# Patient Record
Sex: Male | Born: 1945 | Race: Black or African American | Hispanic: No | Marital: Single | Smoking: Former smoker
Health system: Southern US, Community
[De-identification: ages and names within clinical notes are randomized; demographics above are authoritative.]

## PROBLEM LIST (undated history)

## (undated) DIAGNOSIS — J45909 Unspecified asthma, uncomplicated: Secondary | ICD-10-CM

## (undated) DIAGNOSIS — I10 Essential (primary) hypertension: Secondary | ICD-10-CM

## (undated) DIAGNOSIS — T7840XA Allergy, unspecified, initial encounter: Secondary | ICD-10-CM

## (undated) DIAGNOSIS — C61 Malignant neoplasm of prostate: Secondary | ICD-10-CM

## (undated) HISTORY — DX: Allergy, unspecified, initial encounter: T78.40XA

## (undated) HISTORY — DX: Unspecified asthma, uncomplicated: J45.909

## (undated) HISTORY — PX: PROSTATE BIOPSY: SHX241

## (undated) HISTORY — PX: MOUTH SURGERY: SHX715

---

## 2002-04-28 ENCOUNTER — Ambulatory Visit (HOSPITAL_COMMUNITY): Admission: RE | Admit: 2002-04-28 | Discharge: 2002-04-28 | Payer: Self-pay | Admitting: Gastroenterology

## 2003-12-20 ENCOUNTER — Encounter: Admission: RE | Admit: 2003-12-20 | Discharge: 2003-12-20 | Payer: Self-pay | Admitting: Internal Medicine

## 2011-07-08 ENCOUNTER — Other Ambulatory Visit: Payer: Self-pay | Admitting: Family Medicine

## 2011-07-08 ENCOUNTER — Ambulatory Visit
Admission: RE | Admit: 2011-07-08 | Discharge: 2011-07-08 | Disposition: A | Discharge status: Home / Self Care | Payer: Self-pay | Source: Ambulatory Visit | Attending: Family Medicine | Admitting: Family Medicine

## 2011-07-08 DIAGNOSIS — R059 Cough, unspecified: Secondary | ICD-10-CM

## 2011-07-08 DIAGNOSIS — R05 Cough: Secondary | ICD-10-CM

## 2012-09-15 ENCOUNTER — Encounter: Payer: Managed Care, Other (non HMO) | Attending: Internal Medicine | Admitting: *Deleted

## 2012-09-15 DIAGNOSIS — Z713 Dietary counseling and surveillance: Secondary | ICD-10-CM | POA: Insufficient documentation

## 2012-09-15 DIAGNOSIS — E119 Type 2 diabetes mellitus without complications: Secondary | ICD-10-CM | POA: Insufficient documentation

## 2012-09-24 ENCOUNTER — Encounter: Payer: Self-pay | Admitting: *Deleted

## 2012-09-24 NOTE — Patient Instructions (Signed)
Goals:  Follow Diabetes Meal Plan as instructed  Eat 3 meals and 2 snacks, every 3-5 hrs  Limit carbohydrate intake to 60 grams carbohydrate/meal  Limit carbohydrate intake to 0-30 grams carbohydrate/snack  Add lean protein foods to meals/snacks  Monitor glucose levels as instructed by your doctor  Aim for 15-30 mins of physical activity daily  Bring food record and glucose log to your next nutrition visit 

## 2012-09-24 NOTE — Progress Notes (Signed)
  Patient was seen on 09/15/2012 for the first of a series of three diabetes self-management courses at the [facility]. The following learning objectives were met by the patient during this course:   Defines the role of glucose and insulin  Identifies type of diabetes and pathophysiology  Defines the diagnostic criteria for diabetes and prediabetes  States the risk factors for Type 2 Diabetes  States the symptoms of Type 2 Diabetes  Defines Type 2 Diabetes treatment goals  Defines Type 2 Diabetes treatment options  States the rationale for glucose monitoring  Identifies A1C, glucose targets, and testing times  Identifies proper sharps disposal  Defines the purpose of a diabetes food plan  Identifies carbohydrate food groups  Defines effects of carbohydrate foods on glucose levels  Identifies carbohydrate choices/grams/food labels  States benefits of physical activity and effect on glucose  Review of suggested activity guidelines  Handouts given during class include:  Type 2 Diabetes: Basics Book  My Food Plan Book  Food and Activity Log  Follow-Up Plan: Patient agrees to call to schedule for Core 2 and Core 3 Classes

## 2013-10-11 ENCOUNTER — Other Ambulatory Visit: Payer: Self-pay | Admitting: Nurse Practitioner

## 2013-10-11 ENCOUNTER — Ambulatory Visit
Admission: RE | Admit: 2013-10-11 | Discharge: 2013-10-11 | Disposition: A | Discharge status: Home / Self Care | Payer: Self-pay | Source: Ambulatory Visit | Attending: Nurse Practitioner | Admitting: Nurse Practitioner

## 2013-10-11 DIAGNOSIS — R058 Other specified cough: Secondary | ICD-10-CM

## 2013-10-11 DIAGNOSIS — R05 Cough: Secondary | ICD-10-CM

## 2016-11-21 ENCOUNTER — Ambulatory Visit (INDEPENDENT_AMBULATORY_CARE_PROVIDER_SITE_OTHER): Payer: 59

## 2016-11-21 ENCOUNTER — Encounter (HOSPITAL_COMMUNITY): Payer: Self-pay | Admitting: Family Medicine

## 2016-11-21 ENCOUNTER — Ambulatory Visit (HOSPITAL_COMMUNITY)
Admission: EM | Admit: 2016-11-21 | Discharge: 2016-11-21 | Disposition: A | Discharge status: Home / Self Care | Payer: 59 | Attending: Family Medicine | Admitting: Family Medicine

## 2016-11-21 DIAGNOSIS — J4531 Mild persistent asthma with (acute) exacerbation: Secondary | ICD-10-CM | POA: Diagnosis not present

## 2016-11-21 HISTORY — DX: Essential (primary) hypertension: I10

## 2016-11-21 MED ORDER — AZITHROMYCIN 250 MG PO TABS: ORAL_TABLET | ORAL | 0 refills | Status: DC

## 2016-11-21 MED ORDER — ALBUTEROL SULFATE HFA 108 (90 BASE) MCG/ACT IN AERS: 2.0000 | INHALATION_SPRAY | Freq: Four times a day (QID) | RESPIRATORY_TRACT | 0 refills | Status: DC | PRN

## 2016-11-21 MED ORDER — ALBUTEROL SULFATE (2.5 MG/3ML) 0.083% IN NEBU
INHALATION_SOLUTION | RESPIRATORY_TRACT | Status: AC
  Filled 2016-11-21: qty 6

## 2016-11-21 MED ORDER — METHYLPREDNISOLONE SODIUM SUCC 125 MG IJ SOLR
INTRAMUSCULAR | Status: AC
  Filled 2016-11-21: qty 2

## 2016-11-21 MED ORDER — METHYLPREDNISOLONE 4 MG PO TBPK
ORAL_TABLET | ORAL | 0 refills | Status: DC
Start: 2016-11-21 — End: 2018-03-06

## 2016-11-21 MED ORDER — IPRATROPIUM BROMIDE 0.02 % IN SOLN
0.5000 mg | Freq: Once | RESPIRATORY_TRACT | Status: AC
  Administered 2016-11-21: 0.5 mg via RESPIRATORY_TRACT

## 2016-11-21 MED ORDER — METHYLPREDNISOLONE SODIUM SUCC 125 MG IJ SOLR
125.0000 mg | Freq: Once | INTRAMUSCULAR | Status: AC
  Administered 2016-11-21: 125 mg via INTRAMUSCULAR

## 2016-11-21 MED ORDER — IPRATROPIUM BROMIDE 0.02 % IN SOLN
RESPIRATORY_TRACT | Status: AC
  Filled 2016-11-21: qty 2.5

## 2016-11-21 MED ORDER — ALBUTEROL SULFATE (2.5 MG/3ML) 0.083% IN NEBU
5.0000 mg | INHALATION_SOLUTION | Freq: Once | RESPIRATORY_TRACT | Status: AC
  Administered 2016-11-21: 5 mg via RESPIRATORY_TRACT

## 2016-11-21 NOTE — ED Triage Notes (Signed)
Here for 2 days of URI symptoms and asthma. sts using inhaler with some relief.

## 2016-11-21 NOTE — ED Notes (Signed)
Rounded pt x 2

## 2016-11-21 NOTE — ED Provider Notes (Signed)
[facility]    CSN: [id number] Arrival date & time: 11/21/16  1048     History   Chief Complaint Chief Complaint  Patient presents with  . Cough  . Asthma    HPI Matthew Caldwell is a 70 y.o. male.   The history is provided by the patient.  Cough  Cough characteristics:  Productive Sputum characteristics:  White Severity:  Moderate Duration:  2 days Progression:  Worsening Chronicity:  New Smoker: no   Context: upper respiratory infection and weather changes   Relieved by:  Beta-agonist inhaler Associated symptoms: chills, fever, rhinorrhea and wheezing   Asthma     Past Medical History:  Diagnosis Date  . Asthma   . Hypertension     There are no active problems to display for this patient.   History reviewed. No pertinent surgical history.     Home Medications    Prior to Admission medications   Medication Sig Start Date End Date Taking? Authorizing Provider  acetaminophen-codeine (TYLENOL #3) 300-30 MG per tablet Take 1 tablet by mouth every 4 (four) hours as needed.    Historical Provider, MD  albuterol (PROVENTIL HFA;VENTOLIN HFA) 108 (90 BASE) MCG/ACT inhaler Inhale 2 puffs into the lungs every 6 (six) hours as needed.    Historical Provider, MD  ALPRAZolam Duanne Moron) 0.25 MG tablet Take 0.25 mg by mouth at bedtime as needed.    Historical Provider, MD  fexofenadine (ALLEGRA) 180 MG tablet Take 180 mg by mouth daily.    Historical Provider, MD  hydrocortisone (ANUSOL-HC) 2.5 % rectal cream Place rectally 2 (two) times daily.    Historical Provider, MD  lisinopril-hydrochlorothiazide (PRINZIDE,ZESTORETIC) 20-12.5 MG per tablet Take 1 tablet by mouth daily.    Historical Provider, MD  tadalafil (CIALIS) 20 MG tablet Take 20 mg by mouth daily as needed.    Historical Provider, MD  zolpidem (AMBIEN) 10 MG tablet Take 10 mg by mouth at bedtime as needed.    Historical Provider, MD    Family History History reviewed. No pertinent family  history.  Social History Social History  Substance Use Topics  . Smoking status: Never Smoker  . Smokeless tobacco: Never Used  . Alcohol use Yes     Comment: 3 a week     Allergies   Penicillins   Review of Systems Review of Systems  Constitutional: Positive for chills and fever.  HENT: Positive for congestion, postnasal drip and rhinorrhea.   Respiratory: Positive for cough and wheezing.   Cardiovascular: Negative.   All other systems reviewed and are negative.    Physical Exam Triage Vital Signs ED Triage Vitals [11/21/16 1119]  Enc Vitals Group     BP 174/67     Pulse Rate 90     Resp 20     Temp 99.3 F (37.4 C)     Temp src      SpO2 96 %     Weight      Height      Head Circumference      Peak Flow      Pain Score      Pain Loc      Pain Edu?      Excl. in [place]?    No data found.   Updated Vital Signs BP 174/67   Pulse 90   Temp 99.3 F (37.4 C)   Resp 20   SpO2 96%   Visual Acuity Right Eye Distance:   Left Eye Distance:  Bilateral Distance:    Right Eye Near:   Left Eye Near:    Bilateral Near:     Physical Exam  Constitutional: He is oriented to person, place, and time. He appears well-developed and well-nourished.  HENT:  Right Ear: External ear normal.  Left Ear: External ear normal.  Nose: Nose normal.  Mouth/Throat: Oropharynx is clear and moist.  Eyes: Pupils are equal, round, and reactive to light.  Neck: Normal range of motion. Neck supple.  Cardiovascular: Normal rate, regular rhythm, normal heart sounds and intact distal pulses.   Pulmonary/Chest: He has wheezes. He has rales.  Lymphadenopathy:    He has no cervical adenopathy.  Neurological: He is alert and oriented to person, place, and time.  Skin: Skin is warm and dry.  Nursing note and vitals reviewed.    UC Treatments / Results  Labs (all labs ordered are listed, but only abnormal results are displayed) Labs Reviewed - No data to display  EKG  EKG  Interpretation None       Radiology No results found. X-rays reviewed and report per radiologist.  Procedures Procedures (including critical care time)  Medications Ordered in UC Medications - No data to display   Initial Impression / Assessment and Plan / UC Course  I have reviewed the triage vital signs and the nursing notes.  Pertinent labs & imaging results that were available during my care of the patient were reviewed by me and considered in my medical decision making (see chart for details).  Clinical Course       Final Clinical Impressions(s) / UC Diagnoses   Final diagnoses:  None    New Prescriptions New Prescriptions   No medications on file     Billy Fischer, MD 11/21/16 2108

## 2016-12-10 DIAGNOSIS — I10 Essential (primary) hypertension: Secondary | ICD-10-CM | POA: Diagnosis not present

## 2016-12-10 DIAGNOSIS — Z0000 Encounter for general adult medical examination without abnormal findings: Secondary | ICD-10-CM | POA: Diagnosis not present

## 2016-12-10 DIAGNOSIS — E1165 Type 2 diabetes mellitus with hyperglycemia: Secondary | ICD-10-CM | POA: Diagnosis not present

## 2017-01-28 DIAGNOSIS — R972 Elevated prostate specific antigen [PSA]: Secondary | ICD-10-CM | POA: Diagnosis not present

## 2017-01-28 DIAGNOSIS — N400 Enlarged prostate without lower urinary tract symptoms: Secondary | ICD-10-CM | POA: Diagnosis not present

## 2017-03-13 DIAGNOSIS — N401 Enlarged prostate with lower urinary tract symptoms: Secondary | ICD-10-CM | POA: Diagnosis not present

## 2017-03-13 DIAGNOSIS — R972 Elevated prostate specific antigen [PSA]: Secondary | ICD-10-CM | POA: Diagnosis not present

## 2017-03-13 DIAGNOSIS — E119 Type 2 diabetes mellitus without complications: Secondary | ICD-10-CM | POA: Diagnosis not present

## 2017-04-16 ENCOUNTER — Other Ambulatory Visit: Payer: Self-pay | Admitting: Internal Medicine

## 2017-04-16 DIAGNOSIS — R972 Elevated prostate specific antigen [PSA]: Secondary | ICD-10-CM

## 2017-05-05 ENCOUNTER — Ambulatory Visit
Admission: RE | Admit: 2017-05-05 | Discharge: 2017-05-05 | Disposition: A | Discharge status: Home / Self Care | Payer: 59 | Source: Ambulatory Visit | Attending: Internal Medicine | Admitting: Internal Medicine

## 2017-05-05 DIAGNOSIS — R972 Elevated prostate specific antigen [PSA]: Secondary | ICD-10-CM

## 2017-05-05 MED ORDER — GADOBENATE DIMEGLUMINE 529 MG/ML IV SOLN
19.0000 mL | Freq: Once | INTRAVENOUS | Status: AC | PRN
  Administered 2017-05-05: 19 mL via INTRAVENOUS

## 2017-07-02 DIAGNOSIS — G4700 Insomnia, unspecified: Secondary | ICD-10-CM | POA: Diagnosis not present

## 2017-07-02 DIAGNOSIS — E119 Type 2 diabetes mellitus without complications: Secondary | ICD-10-CM | POA: Diagnosis not present

## 2017-07-02 DIAGNOSIS — I10 Essential (primary) hypertension: Secondary | ICD-10-CM | POA: Diagnosis not present

## 2017-08-18 DIAGNOSIS — H17821 Peripheral opacity of cornea, right eye: Secondary | ICD-10-CM | POA: Diagnosis not present

## 2017-08-18 DIAGNOSIS — H2513 Age-related nuclear cataract, bilateral: Secondary | ICD-10-CM | POA: Diagnosis not present

## 2017-08-18 DIAGNOSIS — H40023 Open angle with borderline findings, high risk, bilateral: Secondary | ICD-10-CM | POA: Diagnosis not present

## 2017-08-25 DIAGNOSIS — R972 Elevated prostate specific antigen [PSA]: Secondary | ICD-10-CM | POA: Diagnosis not present

## 2017-08-28 DIAGNOSIS — Z23 Encounter for immunization: Secondary | ICD-10-CM | POA: Diagnosis not present

## 2017-09-08 DIAGNOSIS — R972 Elevated prostate specific antigen [PSA]: Secondary | ICD-10-CM | POA: Diagnosis not present

## 2017-09-10 ENCOUNTER — Encounter: Payer: Self-pay | Admitting: Radiation Oncology

## 2017-09-18 ENCOUNTER — Encounter: Payer: Self-pay | Admitting: Radiation Oncology

## 2017-09-18 NOTE — Progress Notes (Signed)
GU Location of Tumor / Histology: prostatic adenocarcinoma  If Prostate Cancer, Gleason Score is (3 + 4) and PSA is (7.22). Prostate volume: 26 grams.  Matthew Caldwell reports his PCP, Glendale Chard, noted an elevated PSA in 2016. Patient seen by Dr. Pilar Jarvis in March but, seen by another provider at [facility] (unsure of name) that retired before that.  Biopsies of prostate (if applicable) revealed:    Past/Anticipated interventions by urology, if any: MRI fusion biopsy, referral to radiation oncology  Past/Anticipated interventions by medical oncology, if any: no  Weight changes, if any: no  Bowel/Bladder complaints, if any: nocturia, ED . Patient denies dysuria, hematuria, leakage or incontinence.   Nausea/Vomiting, if any: no  Pain issues, if any:  Occasionally related to sciatica  SAFETY ISSUES:  Prior radiation? no  Pacemaker/ICD? no  Possible current pregnancy? no  Is the patient on methotrexate? no  Current Complaints / other details:  71 year old male. Married. Lives in [place]. Works as a Pharmacist, hospital. Anxious. Most interested in seeds. Patient hopes to receive treatment in that states before returning to [place] in a year.

## 2017-09-24 ENCOUNTER — Ambulatory Visit
Admission: RE | Admit: 2017-09-24 | Discharge: 2017-09-24 | Disposition: A | Discharge status: Home / Self Care | Payer: 59 | Source: Ambulatory Visit | Attending: Radiation Oncology | Admitting: Radiation Oncology

## 2017-09-24 ENCOUNTER — Encounter: Payer: Self-pay | Admitting: Radiation Oncology

## 2017-09-24 VITALS — BP 164/83 | HR 84 | Temp 98.2°F | Resp 16 | Ht 65.0 in | Wt 209.2 lb

## 2017-09-24 DIAGNOSIS — Z87891 Personal history of nicotine dependence: Secondary | ICD-10-CM | POA: Insufficient documentation

## 2017-09-24 DIAGNOSIS — Z880 Allergy status to penicillin: Secondary | ICD-10-CM | POA: Diagnosis not present

## 2017-09-24 DIAGNOSIS — J45909 Unspecified asthma, uncomplicated: Secondary | ICD-10-CM | POA: Insufficient documentation

## 2017-09-24 DIAGNOSIS — C61 Malignant neoplasm of prostate: Secondary | ICD-10-CM | POA: Insufficient documentation

## 2017-09-24 DIAGNOSIS — I10 Essential (primary) hypertension: Secondary | ICD-10-CM | POA: Diagnosis not present

## 2017-09-24 DIAGNOSIS — Z9889 Other specified postprocedural states: Secondary | ICD-10-CM | POA: Insufficient documentation

## 2017-09-24 DIAGNOSIS — Z510 Encounter for antineoplastic radiation therapy: Secondary | ICD-10-CM | POA: Insufficient documentation

## 2017-09-24 DIAGNOSIS — Z7982 Long term (current) use of aspirin: Secondary | ICD-10-CM | POA: Diagnosis not present

## 2017-09-24 DIAGNOSIS — Z79899 Other long term (current) drug therapy: Secondary | ICD-10-CM | POA: Diagnosis not present

## 2017-09-24 DIAGNOSIS — R972 Elevated prostate specific antigen [PSA]: Secondary | ICD-10-CM | POA: Diagnosis not present

## 2017-09-24 DIAGNOSIS — Z7952 Long term (current) use of systemic steroids: Secondary | ICD-10-CM | POA: Insufficient documentation

## 2017-09-24 HISTORY — DX: Malignant neoplasm of prostate: C61

## 2017-09-24 NOTE — Progress Notes (Signed)
Radiation Oncology         [phone]) [phone] ________________________________  Initial Outpatient Consultation  Name: Matthew Caldwell MRN: [id number]  Date: 09/24/2017  DOB: 1946/02/22  WU:[id number], Levada Dy, NP  Nickie Retort, MD   REFERRING PHYSICIAN: Nickie Retort, MD  DIAGNOSIS: 72 y.o. gentleman with Stage T1c adenocarcinoma of the prostate with Gleason Score of 3+4, and PSA of 7.22    ICD-10-CM   1. Malignant neoplasm of prostate ([place]) C61     HISTORY OF PRESENT ILLNESS: Matthew Caldwell is a 71 y.o. male with a diagnosis of prostate cancer. He was noted to have an elevated PSA of 3.63 by his primary care physician, Dr. Glendale Chard, in November 2016. He was referred for evaluation at [facility] and DRE at the time revealed a symmetrical, non-nodular prostate.  The patient elected to defer prostate biopsy and instead proceed with close monitoring of his PSA. PSA in January 2018 was elevated to 7.4. Accordingly, he was referred back to urology for further evaluation.  He was seen by Dr. Baruch Gouty on 01/28/2017, where a digital rectal examination was performed at that time revealing a symmetrical prostate without nodules. He had a repeat PSA at that time to confirm true evaluation. PSA on 01/28/17 was lower at 5.36 but still elevated. An MRI of the prostate was performed on 05/05/17 which showed an 11 mm lesion in the right lateral mid gland, considered PI-RADS 3. The lesion appeared to abut the capsule but there was no definite evidence of ECE, SVI, PNI, LVI or metastatic disease. The patient proceeded to MRI fusion biopsy of the prostate on 08/25/2017. Repeat PSA at the time of biopsy was 7.22. The prostate volume measured 26 cc.  Out of 16 samples, 5 were positive.  The maximum Gleason score was 3+4, and this was seen in the right base lateral core as well as in 4 cores (MRI-targeted samples) from the lesion in the right mid lateral gland.  Biopsies of prostate (if  applicable) revealed:       The patient reviewed the biopsy results with his urologist and he has kindly been referred today for discussion of potential radiation treatment options. He would like to receive treatment while in the [place] before returning to [place] in one year.   PREVIOUS RADIATION THERAPY: No  PAST MEDICAL HISTORY:  Past Medical History:  Diagnosis Date  . Asthma   . Hypertension   . Prostate cancer ([place])       PAST SURGICAL HISTORY: Past Surgical History:  Procedure Laterality Date  . MOUTH SURGERY    . PROSTATE BIOPSY      FAMILY HISTORY:  Family History  Problem Relation Age of Onset  . Cancer Neg Hx     SOCIAL HISTORY:  Social History   Social History  . Marital status: Single    Spouse name: N/A  . Number of children: N/A  . Years of education: N/A   Occupational History  . Not on file.   Social History Main Topics  . Smoking status: Former Smoker    Packs/day: 0.25    Years: 1.00    Types: Cigarettes    Quit date: 11/25/1997  . Smokeless tobacco: Never Used  . Alcohol use Yes     Comment: 3 a week  . Drug use: No  . Sexual activity: Yes   Other Topics Concern  . Not on file   Social History Narrative  . No narrative on file  He is  married and resides in [place], working as a Network engineer at [employer]. He is originally from [place]. He enjoys volunteering (storytelling in kindergarten classes) and is very active with cycling and going to the gym.   ALLERGIES: Penicillins  MEDICATIONS:  Current Outpatient Prescriptions  Medication Sig Dispense Refill  . albuterol (PROVENTIL HFA;VENTOLIN HFA) 108 (90 Base) MCG/ACT inhaler Inhale 2 puffs into the lungs every 6 (six) hours as needed for wheezing or shortness of breath. 1 Inhaler 0  . ALPRAZolam (XANAX) 0.25 MG tablet Take 0.25 mg by mouth at bedtime as needed.    Marland Kitchen aspirin 325 MG tablet Take 325 mg by mouth daily.    . diphenhydrAMINE (SOMINEX) 25 MG tablet Take 25 mg  by mouth at bedtime as needed for sleep.    Marland Kitchen lisinopril-hydrochlorothiazide (PRINZIDE,ZESTORETIC) 20-12.5 MG per tablet Take 1 tablet by mouth daily.    . Multiple Vitamin (MULTIVITAMIN) tablet Take 1 tablet by mouth daily.    . tadalafil (CIALIS) 20 MG tablet Take 20 mg by mouth daily as needed.    . zolpidem (AMBIEN) 10 MG tablet Take 10 mg by mouth at bedtime as needed.    . hydrocortisone (ANUSOL-HC) 2.5 % rectal cream Place rectally 2 (two) times daily.    . methylPREDNISolone (MEDROL DOSEPAK) 4 MG TBPK tablet follow package directions, start on fri, take until finished (Patient not taking: Reported on 09/24/2017) 21 tablet 0   No current facility-administered medications for this encounter.     REVIEW OF SYSTEMS:  On review of systems, the patient reports that he is doing well overall. He denies any chest pain, shortness of breath, cough, fevers, chills, night sweats, or unintended weight changes. He denies any bowel disturbances, and denies abdominal pain, nausea or vomiting. He reports occasional pain related to sciatica. His IPSS was 2, indicating mild urinary symptoms, including nocturia. He denies any dysuria, hematuria, leakage or incontinence. He is able to complete sexual activity with most attempts. He reports having anxiety related to diagnosis and treatment. A complete review of systems is obtained and is otherwise negative.    PHYSICAL EXAM:  Wt Readings from Last 3 Encounters:  09/24/17 209 lb 3.2 oz (94.9 kg)  09/24/12 205 lb 1.6 oz (93 kg)   Temp Readings from Last 3 Encounters:  09/24/17 98.2 F (36.8 C) (Oral)  11/21/16 99.3 F (37.4 C)   BP Readings from Last 3 Encounters:  09/24/17 (!) 164/83  11/21/16 174/67   Pulse Readings from Last 3 Encounters:  09/24/17 84  11/21/16 90   Pain Assessment Pain Score: 0-No pain/10  In general this is a well appearing African man in no acute distress. He is alert and oriented x4 and appropriate throughout the  examination. HEENT reveals that the patient is normocephalic, atraumatic. Cardiovascular exam reveals a regular rate and rhythm, no clicks rubs or murmurs are auscultated. Chest is clear to auscultation bilaterally. Lymphatic assessment is performed and does not reveal any adenopathy in the cervical, supraclavicular, axillary, or inguinal chains. Abdomen has active bowel sounds in all quadrants and is intact. The abdomen is soft, non tender, non distended. Lower extremities are negative for pretibial pitting edema, deep calf tenderness, cyanosis or clubbing.   KPS = 100  100 - Normal; no complaints; no evidence of disease. 90   - Able to carry on normal activity; minor signs or symptoms of disease. 80   - Normal activity with effort; some signs or symptoms of disease. 25   - Cares for  self; unable to carry on normal activity or to do active work. 60   - Requires occasional assistance, but is able to care for most of his personal needs. 50   - Requires considerable assistance and frequent medical care. 63   - Disabled; requires special care and assistance. 4   - Severely disabled; hospital admission is indicated although death not imminent. 42   - Very sick; hospital admission necessary; active supportive treatment necessary. 10   - Moribund; fatal processes progressing rapidly. 0     - Dead  Karnofsky DA, Abelmann WH, Craver LS and Burchenal JH [phone]) The use of the nitrogen mustards in the palliative treatment of carcinoma: with particular reference to bronchogenic carcinoma Cancer 1 634-56  LABORATORY DATA:  No results found for: WBC, HGB, HCT, MCV, PLT No results found for: NA, K, CL, CO2 No results found for: ALT, AST, GGT, ALKPHOS, BILITOT   RADIOGRAPHY: No results found.    IMPRESSION/PLAN: 1. 71 y.o. gentleman with Stage T1c adenocarcinoma of the prostate with Gleason Score of 3+4, and PSA of 7.22. We discussed the patient's workup and outlined the nature of prostate cancer in  this setting. The patient's T stage, Gleason's score, and PSA put him into the intermediate risk group. Accordingly, he is eligible for a variety of potential treatment options including brachytherapy, 8 weeks of external radiation or 5 weeks of external radiation followed by a brachytherapy boost. We discussed the available radiation techniques, and focused on the details of logistics and delivery. We discussed and outlined the risks, benefits, short and long-term effects associated with radiotherapy and compared and contrasted these with prostatectomy. The patient is not interested in surgical removal of the prostate.  At the end of our conversation today, he elects to proceed with 8 weeks of prostate IMRT but would like to postpone starting treatment until 11/2017 due to travel plans in December. 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 We will share our findings with Dr. Pilar Jarvis and move forward with scheduling placement of three gold fiducial markers into the prostate prior to CT SImulation in preparation to proceed with IMRT in January 2019.   We enjoyed meeting with him today, and will look forward to participating in the care of this very nice gentleman.  We spent 75 minutes face to face with the patient and more than 50% of that time was spent in counseling and/or coordination of care.    Nicholos Johns, PA-C    Tyler Pita, MD  [facility] Direct Dial: [phone]  Fax: [phone] [email or web address]  Skype  LinkedIn    Page Me     This document serves as a record of services personally performed by Tyler Pita, MD and Freeman Caldron, PA-C. It was created on their behalf by Rae Lips, a trained medical scribe. The creation of this record is based on the scribe's personal observations and the providers' statements to them. This document has been  checked and approved by the attending providers.

## 2017-09-24 NOTE — Progress Notes (Signed)
See progress note under physician encounter. 

## 2017-09-26 ENCOUNTER — Telehealth: Payer: Self-pay | Admitting: *Deleted

## 2017-09-26 NOTE — Telephone Encounter (Signed)
CALLED PATIENT TO INFORM OF GOLD SEED PLACEMENT ON 10-22-17 - ARRIVAL TIME - 8 AM @ DR. BUDZYN'S OFFICE AND HIS SIM ON 11-28-17 @ 1 PM @ DR. MANNING'S OFFICE, SPOKE WITH PATIENT AND HE IS AWARE OF THESE APPTS.

## 2017-10-09 ENCOUNTER — Encounter: Payer: Self-pay | Admitting: Radiation Oncology

## 2017-10-09 NOTE — Progress Notes (Addendum)
Rec'd documents for FMLA for patient. Placed on cart to give to London Pepper, RN to help complete. Patient brought them to the front window to [facility] and states that they would like to be called on cell number that is correct in Epic when completed.

## 2017-10-14 ENCOUNTER — Telehealth: Payer: Self-pay | Admitting: Radiation Oncology

## 2017-10-14 NOTE — Telephone Encounter (Signed)
Placed orange folder with complete FMLA paperwork in Matthew Caldwell's inbox to sign. 

## 2017-10-15 ENCOUNTER — Encounter: Payer: Self-pay | Admitting: Urology

## 2017-10-15 NOTE — Progress Notes (Signed)
Matthew Caldwell came by for his FMLA forms. I was able to give him the originals and made a copy for his chart to scan. Everything was shown to be completed on our part.

## 2017-10-21 DIAGNOSIS — R0982 Postnasal drip: Secondary | ICD-10-CM | POA: Diagnosis not present

## 2017-10-21 DIAGNOSIS — E119 Type 2 diabetes mellitus without complications: Secondary | ICD-10-CM | POA: Diagnosis not present

## 2017-10-21 DIAGNOSIS — I10 Essential (primary) hypertension: Secondary | ICD-10-CM | POA: Diagnosis not present

## 2017-10-22 DIAGNOSIS — C61 Malignant neoplasm of prostate: Secondary | ICD-10-CM | POA: Diagnosis not present

## 2017-11-27 NOTE — Progress Notes (Signed)
  Radiation Oncology         [phone] ________________________________  Name: Matthew Caldwell MRN: [id number]  Date: 11/28/2017  DOB: 1946/07/14  SIMULATION AND TREATMENT PLANNING NOTE    ICD-10-CM   1. Malignant neoplasm of prostate ([place]) C61     DIAGNOSIS:  72 y.o. gentleman with Stage T1c adenocarcinoma of the prostate with Gleason Score of 3+4, and PSA of 7.22  NARRATIVE:  The patient was brought to the [facility].  Identity was confirmed.  All relevant records and images related to the planned course of therapy were reviewed.  The patient freely provided informed written consent to proceed with treatment after reviewing the details related to the planned course of therapy. The consent form was witnessed and verified by the simulation staff.  Then, the patient was set-up in a stable reproducible supine position for radiation therapy.  A vacuum lock pillow device was custom fabricated to position his legs in a reproducible immobilized position.  Then, I performed a urethrogram under sterile conditions to identify the prostatic apex.  CT images were obtained.  Surface markings were placed.  The CT images were loaded into the planning software.  Then the prostate target and avoidance structures including the rectum, bladder, bowel and hips were contoured.  Treatment planning then occurred.  The radiation prescription was entered and confirmed.  A total of one complex treatment devices was fabricated. I have requested : Intensity Modulated Radiotherapy (IMRT) is medically necessary for this case for the following reason:  Rectal sparing.Marland Kitchen  PLAN:  The patient will receive 78 Gy in 40 fractions.  ________________________________  Sheral Apley Tammi Klippel, M.D.

## 2017-11-28 ENCOUNTER — Encounter: Payer: Self-pay | Admitting: Medical Oncology

## 2017-11-28 ENCOUNTER — Ambulatory Visit
Admission: RE | Admit: 2017-11-28 | Discharge: 2017-11-28 | Disposition: A | Discharge status: Home / Self Care | Payer: 59 | Source: Ambulatory Visit | Attending: Radiation Oncology | Admitting: Radiation Oncology

## 2017-11-28 DIAGNOSIS — C61 Malignant neoplasm of prostate: Secondary | ICD-10-CM | POA: Diagnosis not present

## 2017-11-28 DIAGNOSIS — J45909 Unspecified asthma, uncomplicated: Secondary | ICD-10-CM | POA: Diagnosis not present

## 2017-11-28 DIAGNOSIS — Z510 Encounter for antineoplastic radiation therapy: Secondary | ICD-10-CM | POA: Diagnosis not present

## 2017-11-28 NOTE — Progress Notes (Signed)
Introduced myself to Matthew Caldwell as the prostate nurse navigator and my role. I was unable to meet him the day he consulted with Dr. Tammi Klippel. I gave him my business card and asked him to call me with questions or concerns. I will continue to follow.

## 2017-12-03 ENCOUNTER — Ambulatory Visit: Payer: 59 | Admitting: Radiation Oncology

## 2017-12-04 ENCOUNTER — Ambulatory Visit: Payer: 59

## 2017-12-04 DIAGNOSIS — C61 Malignant neoplasm of prostate: Secondary | ICD-10-CM | POA: Diagnosis not present

## 2017-12-04 DIAGNOSIS — Z510 Encounter for antineoplastic radiation therapy: Secondary | ICD-10-CM | POA: Diagnosis not present

## 2017-12-05 ENCOUNTER — Ambulatory Visit: Payer: 59

## 2017-12-08 ENCOUNTER — Ambulatory Visit: Payer: 59

## 2017-12-09 ENCOUNTER — Ambulatory Visit: Payer: 59

## 2017-12-09 DIAGNOSIS — Z510 Encounter for antineoplastic radiation therapy: Secondary | ICD-10-CM | POA: Diagnosis not present

## 2017-12-09 DIAGNOSIS — C61 Malignant neoplasm of prostate: Secondary | ICD-10-CM | POA: Diagnosis not present

## 2017-12-10 ENCOUNTER — Ambulatory Visit
Admission: RE | Admit: 2017-12-10 | Discharge: 2017-12-10 | Disposition: A | Discharge status: Home / Self Care | Payer: 59 | Source: Ambulatory Visit | Attending: Radiation Oncology | Admitting: Radiation Oncology

## 2017-12-10 ENCOUNTER — Ambulatory Visit: Payer: 59

## 2017-12-10 DIAGNOSIS — Z510 Encounter for antineoplastic radiation therapy: Secondary | ICD-10-CM | POA: Diagnosis not present

## 2017-12-10 DIAGNOSIS — C61 Malignant neoplasm of prostate: Secondary | ICD-10-CM | POA: Diagnosis not present

## 2017-12-11 ENCOUNTER — Ambulatory Visit
Admission: RE | Admit: 2017-12-11 | Discharge: 2017-12-11 | Disposition: A | Discharge status: Home / Self Care | Payer: 59 | Source: Ambulatory Visit | Attending: Radiation Oncology | Admitting: Radiation Oncology

## 2017-12-11 ENCOUNTER — Ambulatory Visit: Payer: 59

## 2017-12-11 DIAGNOSIS — Z510 Encounter for antineoplastic radiation therapy: Secondary | ICD-10-CM | POA: Diagnosis not present

## 2017-12-11 DIAGNOSIS — C61 Malignant neoplasm of prostate: Secondary | ICD-10-CM | POA: Diagnosis not present

## 2017-12-12 ENCOUNTER — Ambulatory Visit
Admission: RE | Admit: 2017-12-12 | Discharge: 2017-12-12 | Disposition: A | Discharge status: Home / Self Care | Payer: 59 | Source: Ambulatory Visit | Attending: Radiation Oncology | Admitting: Radiation Oncology

## 2017-12-12 ENCOUNTER — Ambulatory Visit: Payer: 59

## 2017-12-12 DIAGNOSIS — C61 Malignant neoplasm of prostate: Secondary | ICD-10-CM | POA: Diagnosis not present

## 2017-12-12 DIAGNOSIS — Z510 Encounter for antineoplastic radiation therapy: Secondary | ICD-10-CM | POA: Diagnosis not present

## 2017-12-12 NOTE — Progress Notes (Signed)
Pt here for patient teaching.  Pt given Radiation and You booklet.  Reviewed areas of pertinence such as diarrhea, fatigue, hair loss, nausea and vomiting, skin changes and urinary and bladder changes . Pt able to give teach back of have Imodium on hand and drink plenty of water,avoid applying anything to skin within 4 hours of treatment. Pt verbalizes understanding of information given and will contact nursing with any questions or concerns.     Http://rtanswers.org/treatmentinformation/whattoexpect/index

## 2017-12-15 ENCOUNTER — Ambulatory Visit
Admission: RE | Admit: 2017-12-15 | Discharge: 2017-12-15 | Disposition: A | Discharge status: Home / Self Care | Payer: 59 | Source: Ambulatory Visit | Attending: Radiation Oncology | Admitting: Radiation Oncology

## 2017-12-15 ENCOUNTER — Ambulatory Visit: Payer: 59

## 2017-12-15 DIAGNOSIS — Z510 Encounter for antineoplastic radiation therapy: Secondary | ICD-10-CM | POA: Diagnosis not present

## 2017-12-15 DIAGNOSIS — C61 Malignant neoplasm of prostate: Secondary | ICD-10-CM | POA: Diagnosis not present

## 2017-12-16 ENCOUNTER — Ambulatory Visit
Admission: RE | Admit: 2017-12-16 | Discharge: 2017-12-16 | Disposition: A | Discharge status: Home / Self Care | Payer: 59 | Source: Ambulatory Visit | Attending: Radiation Oncology | Admitting: Radiation Oncology

## 2017-12-16 ENCOUNTER — Ambulatory Visit: Payer: 59

## 2017-12-16 DIAGNOSIS — Z510 Encounter for antineoplastic radiation therapy: Secondary | ICD-10-CM | POA: Diagnosis not present

## 2017-12-16 DIAGNOSIS — C61 Malignant neoplasm of prostate: Secondary | ICD-10-CM | POA: Diagnosis not present

## 2017-12-17 ENCOUNTER — Ambulatory Visit
Admission: RE | Admit: 2017-12-17 | Discharge: 2017-12-17 | Disposition: A | Discharge status: Home / Self Care | Payer: 59 | Source: Ambulatory Visit | Attending: Radiation Oncology | Admitting: Radiation Oncology

## 2017-12-17 ENCOUNTER — Ambulatory Visit: Payer: 59

## 2017-12-17 DIAGNOSIS — C61 Malignant neoplasm of prostate: Secondary | ICD-10-CM | POA: Diagnosis not present

## 2017-12-17 DIAGNOSIS — Z510 Encounter for antineoplastic radiation therapy: Secondary | ICD-10-CM | POA: Diagnosis not present

## 2017-12-18 ENCOUNTER — Ambulatory Visit: Payer: 59

## 2017-12-18 ENCOUNTER — Ambulatory Visit
Admission: RE | Admit: 2017-12-18 | Discharge: 2017-12-18 | Disposition: A | Discharge status: Home / Self Care | Payer: 59 | Source: Ambulatory Visit | Attending: Radiation Oncology | Admitting: Radiation Oncology

## 2017-12-18 DIAGNOSIS — Z510 Encounter for antineoplastic radiation therapy: Secondary | ICD-10-CM | POA: Diagnosis not present

## 2017-12-18 DIAGNOSIS — C61 Malignant neoplasm of prostate: Secondary | ICD-10-CM | POA: Diagnosis not present

## 2017-12-19 ENCOUNTER — Ambulatory Visit: Payer: 59

## 2017-12-19 ENCOUNTER — Ambulatory Visit
Admission: RE | Admit: 2017-12-19 | Discharge: 2017-12-19 | Disposition: A | Discharge status: Home / Self Care | Payer: 59 | Source: Ambulatory Visit | Attending: Radiation Oncology | Admitting: Radiation Oncology

## 2017-12-19 DIAGNOSIS — Z510 Encounter for antineoplastic radiation therapy: Secondary | ICD-10-CM | POA: Diagnosis not present

## 2017-12-19 DIAGNOSIS — C61 Malignant neoplasm of prostate: Secondary | ICD-10-CM | POA: Diagnosis not present

## 2017-12-22 ENCOUNTER — Ambulatory Visit: Payer: 59

## 2017-12-22 ENCOUNTER — Ambulatory Visit
Admission: RE | Admit: 2017-12-22 | Discharge: 2017-12-22 | Disposition: A | Discharge status: Home / Self Care | Payer: 59 | Source: Ambulatory Visit | Attending: Radiation Oncology | Admitting: Radiation Oncology

## 2017-12-22 DIAGNOSIS — Z510 Encounter for antineoplastic radiation therapy: Secondary | ICD-10-CM | POA: Diagnosis not present

## 2017-12-22 DIAGNOSIS — C61 Malignant neoplasm of prostate: Secondary | ICD-10-CM | POA: Diagnosis not present

## 2017-12-23 ENCOUNTER — Ambulatory Visit: Payer: 59

## 2017-12-23 ENCOUNTER — Ambulatory Visit
Admission: RE | Admit: 2017-12-23 | Discharge: 2017-12-23 | Disposition: A | Discharge status: Home / Self Care | Payer: 59 | Source: Ambulatory Visit | Attending: Radiation Oncology | Admitting: Radiation Oncology

## 2017-12-23 DIAGNOSIS — C61 Malignant neoplasm of prostate: Secondary | ICD-10-CM | POA: Insufficient documentation

## 2017-12-23 DIAGNOSIS — Z510 Encounter for antineoplastic radiation therapy: Secondary | ICD-10-CM | POA: Insufficient documentation

## 2017-12-24 ENCOUNTER — Ambulatory Visit
Admission: RE | Admit: 2017-12-24 | Discharge: 2017-12-24 | Disposition: A | Discharge status: Home / Self Care | Payer: 59 | Source: Ambulatory Visit | Attending: Radiation Oncology | Admitting: Radiation Oncology

## 2017-12-24 DIAGNOSIS — C61 Malignant neoplasm of prostate: Secondary | ICD-10-CM | POA: Diagnosis not present

## 2017-12-25 ENCOUNTER — Ambulatory Visit
Admission: RE | Admit: 2017-12-25 | Discharge: 2017-12-25 | Disposition: A | Discharge status: Home / Self Care | Payer: 59 | Source: Ambulatory Visit | Attending: Radiation Oncology | Admitting: Radiation Oncology

## 2017-12-25 DIAGNOSIS — C61 Malignant neoplasm of prostate: Secondary | ICD-10-CM | POA: Diagnosis not present

## 2017-12-26 ENCOUNTER — Ambulatory Visit
Admission: RE | Admit: 2017-12-26 | Discharge: 2017-12-26 | Disposition: A | Discharge status: Home / Self Care | Payer: 59 | Source: Ambulatory Visit | Attending: Radiation Oncology | Admitting: Radiation Oncology

## 2017-12-26 DIAGNOSIS — C61 Malignant neoplasm of prostate: Secondary | ICD-10-CM | POA: Diagnosis not present

## 2017-12-26 DIAGNOSIS — Z510 Encounter for antineoplastic radiation therapy: Secondary | ICD-10-CM | POA: Diagnosis not present

## 2017-12-29 ENCOUNTER — Ambulatory Visit
Admission: RE | Admit: 2017-12-29 | Discharge: 2017-12-29 | Disposition: A | Discharge status: Home / Self Care | Payer: 59 | Source: Ambulatory Visit | Attending: Radiation Oncology | Admitting: Radiation Oncology

## 2017-12-29 DIAGNOSIS — C61 Malignant neoplasm of prostate: Secondary | ICD-10-CM | POA: Diagnosis not present

## 2017-12-30 ENCOUNTER — Ambulatory Visit
Admission: RE | Admit: 2017-12-30 | Discharge: 2017-12-30 | Disposition: A | Discharge status: Home / Self Care | Payer: 59 | Source: Ambulatory Visit | Attending: Radiation Oncology | Admitting: Radiation Oncology

## 2017-12-30 DIAGNOSIS — C61 Malignant neoplasm of prostate: Secondary | ICD-10-CM | POA: Diagnosis not present

## 2017-12-31 ENCOUNTER — Ambulatory Visit
Admission: RE | Admit: 2017-12-31 | Discharge: 2017-12-31 | Disposition: A | Discharge status: Home / Self Care | Payer: 59 | Source: Ambulatory Visit | Attending: Radiation Oncology | Admitting: Radiation Oncology

## 2017-12-31 DIAGNOSIS — C61 Malignant neoplasm of prostate: Secondary | ICD-10-CM | POA: Diagnosis not present

## 2018-01-01 ENCOUNTER — Ambulatory Visit
Admission: RE | Admit: 2018-01-01 | Discharge: 2018-01-01 | Disposition: A | Discharge status: Home / Self Care | Payer: 59 | Source: Ambulatory Visit | Attending: Radiation Oncology | Admitting: Radiation Oncology

## 2018-01-01 DIAGNOSIS — C61 Malignant neoplasm of prostate: Secondary | ICD-10-CM | POA: Diagnosis not present

## 2018-01-02 ENCOUNTER — Ambulatory Visit
Admission: RE | Admit: 2018-01-02 | Discharge: 2018-01-02 | Disposition: A | Discharge status: Home / Self Care | Payer: 59 | Source: Ambulatory Visit | Attending: Radiation Oncology | Admitting: Radiation Oncology

## 2018-01-02 DIAGNOSIS — C61 Malignant neoplasm of prostate: Secondary | ICD-10-CM | POA: Diagnosis not present

## 2018-01-05 ENCOUNTER — Ambulatory Visit
Admission: RE | Admit: 2018-01-05 | Discharge: 2018-01-05 | Disposition: A | Discharge status: Home / Self Care | Payer: 59 | Source: Ambulatory Visit | Attending: Radiation Oncology | Admitting: Radiation Oncology

## 2018-01-05 DIAGNOSIS — C61 Malignant neoplasm of prostate: Secondary | ICD-10-CM | POA: Diagnosis not present

## 2018-01-06 ENCOUNTER — Ambulatory Visit
Admission: RE | Admit: 2018-01-06 | Discharge: 2018-01-06 | Disposition: A | Discharge status: Home / Self Care | Payer: 59 | Source: Ambulatory Visit | Attending: Radiation Oncology | Admitting: Radiation Oncology

## 2018-01-06 DIAGNOSIS — C61 Malignant neoplasm of prostate: Secondary | ICD-10-CM | POA: Diagnosis not present

## 2018-01-07 ENCOUNTER — Ambulatory Visit
Admission: RE | Admit: 2018-01-07 | Discharge: 2018-01-07 | Disposition: A | Discharge status: Home / Self Care | Payer: 59 | Source: Ambulatory Visit | Attending: Radiation Oncology | Admitting: Radiation Oncology

## 2018-01-07 DIAGNOSIS — C61 Malignant neoplasm of prostate: Secondary | ICD-10-CM | POA: Diagnosis not present

## 2018-01-08 ENCOUNTER — Ambulatory Visit
Admission: RE | Admit: 2018-01-08 | Discharge: 2018-01-08 | Disposition: A | Discharge status: Home / Self Care | Payer: 59 | Source: Ambulatory Visit | Attending: Radiation Oncology | Admitting: Radiation Oncology

## 2018-01-08 DIAGNOSIS — C61 Malignant neoplasm of prostate: Secondary | ICD-10-CM | POA: Diagnosis not present

## 2018-01-09 ENCOUNTER — Ambulatory Visit
Admission: RE | Admit: 2018-01-09 | Discharge: 2018-01-09 | Disposition: A | Discharge status: Home / Self Care | Payer: 59 | Source: Ambulatory Visit | Attending: Radiation Oncology | Admitting: Radiation Oncology

## 2018-01-09 DIAGNOSIS — C61 Malignant neoplasm of prostate: Secondary | ICD-10-CM | POA: Diagnosis not present

## 2018-01-12 ENCOUNTER — Ambulatory Visit
Admission: RE | Admit: 2018-01-12 | Discharge: 2018-01-12 | Disposition: A | Discharge status: Home / Self Care | Payer: 59 | Source: Ambulatory Visit | Attending: Radiation Oncology | Admitting: Radiation Oncology

## 2018-01-12 DIAGNOSIS — C61 Malignant neoplasm of prostate: Secondary | ICD-10-CM | POA: Diagnosis not present

## 2018-01-13 ENCOUNTER — Ambulatory Visit
Admission: RE | Admit: 2018-01-13 | Discharge: 2018-01-13 | Disposition: A | Discharge status: Home / Self Care | Payer: 59 | Source: Ambulatory Visit | Attending: Radiation Oncology | Admitting: Radiation Oncology

## 2018-01-13 DIAGNOSIS — C61 Malignant neoplasm of prostate: Secondary | ICD-10-CM | POA: Diagnosis not present

## 2018-01-14 ENCOUNTER — Ambulatory Visit
Admission: RE | Admit: 2018-01-14 | Discharge: 2018-01-14 | Disposition: A | Discharge status: Home / Self Care | Payer: 59 | Source: Ambulatory Visit | Attending: Radiation Oncology | Admitting: Radiation Oncology

## 2018-01-14 DIAGNOSIS — C61 Malignant neoplasm of prostate: Secondary | ICD-10-CM | POA: Diagnosis not present

## 2018-01-15 ENCOUNTER — Ambulatory Visit
Admission: RE | Admit: 2018-01-15 | Discharge: 2018-01-15 | Disposition: A | Discharge status: Home / Self Care | Payer: 59 | Source: Ambulatory Visit | Attending: Radiation Oncology | Admitting: Radiation Oncology

## 2018-01-15 DIAGNOSIS — C61 Malignant neoplasm of prostate: Secondary | ICD-10-CM | POA: Diagnosis not present

## 2018-01-16 ENCOUNTER — Ambulatory Visit
Admission: RE | Admit: 2018-01-16 | Discharge: 2018-01-16 | Disposition: A | Discharge status: Home / Self Care | Payer: 59 | Source: Ambulatory Visit | Attending: Radiation Oncology | Admitting: Radiation Oncology

## 2018-01-16 DIAGNOSIS — C61 Malignant neoplasm of prostate: Secondary | ICD-10-CM | POA: Diagnosis not present

## 2018-01-19 ENCOUNTER — Ambulatory Visit
Admission: RE | Admit: 2018-01-19 | Discharge: 2018-01-19 | Disposition: A | Discharge status: Home / Self Care | Payer: 59 | Source: Ambulatory Visit | Attending: Radiation Oncology | Admitting: Radiation Oncology

## 2018-01-19 DIAGNOSIS — C61 Malignant neoplasm of prostate: Secondary | ICD-10-CM | POA: Diagnosis not present

## 2018-01-20 ENCOUNTER — Ambulatory Visit
Admission: RE | Admit: 2018-01-20 | Discharge: 2018-01-20 | Disposition: A | Discharge status: Home / Self Care | Payer: 59 | Source: Ambulatory Visit | Attending: Radiation Oncology | Admitting: Radiation Oncology

## 2018-01-20 DIAGNOSIS — C61 Malignant neoplasm of prostate: Secondary | ICD-10-CM | POA: Diagnosis not present

## 2018-01-21 ENCOUNTER — Ambulatory Visit
Admission: RE | Admit: 2018-01-21 | Discharge: 2018-01-21 | Disposition: A | Discharge status: Home / Self Care | Payer: 59 | Source: Ambulatory Visit | Attending: Radiation Oncology | Admitting: Radiation Oncology

## 2018-01-21 DIAGNOSIS — C61 Malignant neoplasm of prostate: Secondary | ICD-10-CM | POA: Diagnosis not present

## 2018-01-22 ENCOUNTER — Ambulatory Visit
Admission: RE | Admit: 2018-01-22 | Discharge: 2018-01-22 | Disposition: A | Discharge status: Home / Self Care | Payer: 59 | Source: Ambulatory Visit | Attending: Radiation Oncology | Admitting: Radiation Oncology

## 2018-01-22 DIAGNOSIS — C61 Malignant neoplasm of prostate: Secondary | ICD-10-CM | POA: Diagnosis not present

## 2018-01-23 ENCOUNTER — Ambulatory Visit
Admission: RE | Admit: 2018-01-23 | Discharge: 2018-01-23 | Disposition: A | Discharge status: Home / Self Care | Payer: 59 | Source: Ambulatory Visit | Attending: Radiation Oncology | Admitting: Radiation Oncology

## 2018-01-23 DIAGNOSIS — C61 Malignant neoplasm of prostate: Secondary | ICD-10-CM | POA: Insufficient documentation

## 2018-01-23 DIAGNOSIS — Z510 Encounter for antineoplastic radiation therapy: Secondary | ICD-10-CM | POA: Insufficient documentation

## 2018-01-26 ENCOUNTER — Ambulatory Visit
Admission: RE | Admit: 2018-01-26 | Discharge: 2018-01-26 | Disposition: A | Discharge status: Home / Self Care | Payer: 59 | Source: Ambulatory Visit | Attending: Radiation Oncology | Admitting: Radiation Oncology

## 2018-01-26 DIAGNOSIS — C61 Malignant neoplasm of prostate: Secondary | ICD-10-CM | POA: Diagnosis not present

## 2018-01-27 ENCOUNTER — Ambulatory Visit
Admission: RE | Admit: 2018-01-27 | Discharge: 2018-01-27 | Disposition: A | Discharge status: Home / Self Care | Payer: 59 | Source: Ambulatory Visit | Attending: Radiation Oncology | Admitting: Radiation Oncology

## 2018-01-27 DIAGNOSIS — C61 Malignant neoplasm of prostate: Secondary | ICD-10-CM | POA: Diagnosis not present

## 2018-01-28 ENCOUNTER — Ambulatory Visit
Admission: RE | Admit: 2018-01-28 | Discharge: 2018-01-28 | Disposition: A | Discharge status: Home / Self Care | Payer: 59 | Source: Ambulatory Visit | Attending: Radiation Oncology | Admitting: Radiation Oncology

## 2018-01-28 DIAGNOSIS — E1165 Type 2 diabetes mellitus with hyperglycemia: Secondary | ICD-10-CM | POA: Diagnosis not present

## 2018-01-28 DIAGNOSIS — C61 Malignant neoplasm of prostate: Secondary | ICD-10-CM | POA: Diagnosis not present

## 2018-01-28 DIAGNOSIS — Z0000 Encounter for general adult medical examination without abnormal findings: Secondary | ICD-10-CM | POA: Diagnosis not present

## 2018-01-28 DIAGNOSIS — I10 Essential (primary) hypertension: Secondary | ICD-10-CM | POA: Diagnosis not present

## 2018-01-29 ENCOUNTER — Ambulatory Visit
Admission: RE | Admit: 2018-01-29 | Discharge: 2018-01-29 | Disposition: A | Discharge status: Home / Self Care | Payer: 59 | Source: Ambulatory Visit | Attending: Radiation Oncology | Admitting: Radiation Oncology

## 2018-01-29 DIAGNOSIS — C61 Malignant neoplasm of prostate: Secondary | ICD-10-CM | POA: Diagnosis not present

## 2018-01-30 ENCOUNTER — Ambulatory Visit
Admission: RE | Admit: 2018-01-30 | Discharge: 2018-01-30 | Disposition: A | Discharge status: Home / Self Care | Payer: 59 | Source: Ambulatory Visit | Attending: Radiation Oncology | Admitting: Radiation Oncology

## 2018-01-30 DIAGNOSIS — C61 Malignant neoplasm of prostate: Secondary | ICD-10-CM | POA: Diagnosis not present

## 2018-02-02 ENCOUNTER — Ambulatory Visit
Admission: RE | Admit: 2018-02-02 | Discharge: 2018-02-02 | Disposition: A | Discharge status: Home / Self Care | Payer: 59 | Source: Ambulatory Visit | Attending: Radiation Oncology | Admitting: Radiation Oncology

## 2018-02-02 ENCOUNTER — Encounter: Payer: Self-pay | Admitting: Radiation Oncology

## 2018-02-02 DIAGNOSIS — Z0000 Encounter for general adult medical examination without abnormal findings: Secondary | ICD-10-CM | POA: Diagnosis not present

## 2018-02-02 DIAGNOSIS — C61 Malignant neoplasm of prostate: Secondary | ICD-10-CM | POA: Diagnosis not present

## 2018-02-02 DIAGNOSIS — I10 Essential (primary) hypertension: Secondary | ICD-10-CM | POA: Diagnosis not present

## 2018-02-02 DIAGNOSIS — E1165 Type 2 diabetes mellitus with hyperglycemia: Secondary | ICD-10-CM | POA: Diagnosis not present

## 2018-02-03 ENCOUNTER — Ambulatory Visit (HOSPITAL_COMMUNITY)
Admission: EM | Admit: 2018-02-03 | Discharge: 2018-02-03 | Disposition: A | Discharge status: Home / Self Care | Payer: 59 | Attending: Family Medicine | Admitting: Family Medicine

## 2018-02-03 ENCOUNTER — Encounter (HOSPITAL_COMMUNITY): Payer: Self-pay | Admitting: Emergency Medicine

## 2018-02-03 ENCOUNTER — Telehealth: Payer: Self-pay | Admitting: Radiation Oncology

## 2018-02-03 DIAGNOSIS — R69 Illness, unspecified: Secondary | ICD-10-CM

## 2018-02-03 DIAGNOSIS — J111 Influenza due to unidentified influenza virus with other respiratory manifestations: Secondary | ICD-10-CM

## 2018-02-03 MED ORDER — OSELTAMIVIR PHOSPHATE 75 MG PO CAPS: 75.0000 mg | ORAL_CAPSULE | Freq: Two times a day (BID) | ORAL | 0 refills | Status: AC

## 2018-02-03 MED ORDER — HYDROCODONE-HOMATROPINE 5-1.5 MG/5ML PO SYRP: 5.0000 mL | ORAL_SOLUTION | Freq: Four times a day (QID) | ORAL | 0 refills | Status: DC | PRN

## 2018-02-03 NOTE — Discharge Instructions (Signed)

## 2018-02-03 NOTE — ED Triage Notes (Signed)
PT reports cough and sweats that started yesterday.

## 2018-02-03 NOTE — Telephone Encounter (Signed)
Received voicemail from patient requesting return call. Phoned patient back. Patient reports he just completed radiation therapy and now he has "a fever, sweats, cough and sore throat." Explained these symptoms are not related to effects of prostate radiation. Patient verbalized understanding. Stressed patient should contact his PCP and obtain an appointment asap. Patient verbalized understanding.

## 2018-02-04 NOTE — Progress Notes (Signed)
  Radiation Oncology         [phone] ________________________________  Name: Matthew Caldwell MRN: [id number]  Date: 02/02/2018  DOB: 1946/07/12  End of Treatment Note  Diagnosis:   72 y.o. male with Stage T1c adenocarcinoma of the prostate with Gleason Score of 3+4, and PSA of 7.22  Indication for treatment:  Curative, Definitive Radiotherapy       Radiation treatment dates:   12/09/2017 - 02/02/2018  Site/dose:   The prostate was treated to 78 Gy in 40 fractions of 1.95 Gy  Beams/energy:   The patient was treated with IMRT using volumetric arc therapy delivering 6 MV X-rays to clockwise and counterclockwise circumferential arcs with a 90 degree collimator offset to avoid dose scalloping.  Image guidance was performed with daily cone beam CT prior to each fraction to align to gold markers in the prostate and assure proper bladder and rectal fill volumes.  Immobilization was achieved with BodyFix custom mold.  Narrative: The patient tolerated radiation treatment relatively well.   He experienced modest fatigue and some urinary irritation with symptoms of urgency, post void dribble, incomplete emptying, nocturia x6, and dysuria. He denied gross hematuria or incontinence. He also reported having loose stools toward the end of treatment and was advised to use Imodium for relief.  Plan: The patient has completed radiation treatment. He will return to radiation oncology clinic for routine followup in one month. I advised him to call or return sooner if he has any questions or concerns related to his recovery or treatment. ________________________________  Sheral Apley. Tammi Klippel, M.D.  This document serves as a record of services personally performed by Tyler Pita, MD. It was created on his behalf by Rae Lips, a trained medical scribe. The creation of this record is based on the scribe's personal observations and the provider's statements to them. This document has been checked and  approved by the attending provider.

## 2018-02-04 NOTE — ED Provider Notes (Signed)
[place]   [id number] 02/03/18 Arrival Time: 8563  ASSESSMENT & PLAN:  1. Influenza-like illness     Meds ordered this encounter  Medications  . HYDROcodone-homatropine (HYCODAN) 5-1.5 MG/5ML syrup    Sig: Take 5 mLs by mouth every 6 (six) hours as needed for cough.    Dispense:  90 mL    Refill:  0  . oseltamivir (TAMIFLU) 75 MG capsule    Sig: Take 1 capsule (75 mg total) by mouth 2 (two) times daily for 5 days.    Dispense:  10 capsule    Refill:  0   Cough medication sedation precautions. Discussed typical duration of symptoms. OTC symptom care as needed. Ensure adequate fluid intake and rest. May f/u with PCP or here as needed.  Reviewed expectations re: course of current medical issues. Questions answered. Outlined signs and symptoms indicating need for more acute intervention. Patient verbalized understanding. After Visit Summary given.   SUBJECTIVE: History from: patient.  Matthew Caldwell is a 72 y.o. male who presents with complaint of nasal congestion, post-nasal drainage, and a persistent dry cough. Onset abrupt, approximately 1 day ago. Overall fatigued with body aches. SOB: none. Wheezing: none. Fever: yes, subjective. Overall normal PO intake without n/v. Sick contacts: no. OTC treatment: none.  Social History   Tobacco Use  Smoking Status Former Smoker  . Packs/day: 0.25  . Years: 1.00  . Pack years: 0.25  . Types: Cigarettes  . Last attempt to quit: 11/25/1997  . Years since quitting: 20.2  Smokeless Tobacco Never Used    ROS: As per HPI.   OBJECTIVE:  Vitals:   02/03/18 1825 02/03/18 1826  BP:  (!) 151/51  Pulse:  84  Resp:  16  Temp:  99.5 F (37.5 C)  TempSrc:  Oral  SpO2:  98%  Weight: 207 lb (93.9 kg)      General appearance: alert; appears fatigued HEENT: nasal congestion; clear runny nose; throat irritation secondary to post-nasal drainage Neck: supple without LAD Lungs: unlabored respirations, symmetrical  air entry; cough: moderate; no respiratory distress Skin: warm and dry Psychological: alert and cooperative; normal mood and affect  Imaging: No results found.  Allergies  Allergen Reactions  . Penicillins Rash    Past Medical History:  Diagnosis Date  . Asthma   . Hypertension   . Prostate cancer [facility])    Family History  Problem Relation Age of Onset  . Cancer Neg Hx    Social History   Socioeconomic History  . Marital status: Single    Spouse name: Not on file  . Number of children: Not on file  . Years of education: Not on file  . Highest education level: Not on file  Social Needs  . Financial resource strain: Not on file  . Food insecurity - worry: Not on file  . Food insecurity - inability: Not on file  . Transportation needs - medical: Not on file  . Transportation needs - non-medical: Not on file  Occupational History  . Not on file  Tobacco Use  . Smoking status: Former Smoker    Packs/day: 0.25    Years: 1.00    Pack years: 0.25    Types: Cigarettes    Last attempt to quit: 11/25/1997    Years since quitting: 20.2  . Smokeless tobacco: Never Used  Substance and Sexual Activity  . Alcohol use: Yes    Comment: 3 a week  . Drug use: No  . Sexual activity: Yes  Other Topics Concern  . Not on file  Social History Narrative  . Not on file           Vanessa Kick, MD 02/04/18 [phone]

## 2018-03-06 ENCOUNTER — Ambulatory Visit
Admission: RE | Admit: 2018-03-06 | Discharge: 2018-03-06 | Disposition: A | Discharge status: Home / Self Care | Payer: 59 | Source: Ambulatory Visit | Attending: Urology | Admitting: Urology

## 2018-03-06 ENCOUNTER — Other Ambulatory Visit: Payer: Self-pay

## 2018-03-06 ENCOUNTER — Encounter: Payer: Self-pay | Admitting: Urology

## 2018-03-06 VITALS — BP 160/76 | HR 64 | Temp 98.1°F | Resp 18 | Ht 65.0 in | Wt 211.6 lb

## 2018-03-06 DIAGNOSIS — Z79899 Other long term (current) drug therapy: Secondary | ICD-10-CM | POA: Diagnosis not present

## 2018-03-06 DIAGNOSIS — Z923 Personal history of irradiation: Secondary | ICD-10-CM | POA: Insufficient documentation

## 2018-03-06 DIAGNOSIS — C61 Malignant neoplasm of prostate: Secondary | ICD-10-CM

## 2018-03-06 DIAGNOSIS — Z880 Allergy status to penicillin: Secondary | ICD-10-CM | POA: Diagnosis not present

## 2018-03-06 DIAGNOSIS — Z7982 Long term (current) use of aspirin: Secondary | ICD-10-CM | POA: Insufficient documentation

## 2018-03-06 NOTE — Progress Notes (Signed)
Radiation Oncology         [phone] ________________________________  Name: Teruo Stilley MRN: [id number]  Date: 03/06/2018  DOB: May 13, 1946  Post Treatment Note  CC: Cooper Render, NP  Nickie Retort, MD  Diagnosis:   72 y.o. male with Stage T1c adenocarcinoma of the prostate with Gleason Score of 3+4, and PSA of 7.22  Interval Since Last Radiation:  4 weeks  12/09/2017 - 02/02/2018:  The prostate was treated to 78 Gy in 40 fractions of 1.95 Gy  Narrative:  The patient returns today for routine follow-up.  He tolerated radiation treatment relatively well.   He experienced modest fatigue and some urinary irritation with symptoms of urgency, post void dribble, incomplete emptying, nocturia x6, and dysuria. He denied gross hematuria or incontinence. He also reported having loose stools toward the end of treatment and was advised to use Imodium for relief.                            On review of systems, the patient states that he is doing very well overall.  He reports significant improvement in his lower urinary tract symptoms with a current IPS S score of 5 indicating mild symptoms.  He reports nocturia x2 per night but denies excessive daytime frequency or urgency.  He specifically denies dysuria, gross hematuria, straining to void, incomplete emptying or incontinence.  He has noticed a gradual return of his energy and his return to work, Printmaker at [employer], without difficulty.  He denies abdominal pain, nausea, vomiting or diarrhea.  He reports a healthy appetite and is maintaining his weight.  ALLERGIES:  is allergic to penicillins.  Meds: Current Outpatient Medications  Medication Sig Dispense Refill  . albuterol (PROVENTIL HFA;VENTOLIN HFA) 108 (90 Base) MCG/ACT inhaler Inhale 2 puffs into the lungs every 6 (six) hours as needed for wheezing or shortness of breath. 1 Inhaler 0  . ALPRAZolam (XANAX) 0.25 MG tablet Take 0.25 mg by mouth at bedtime as needed.    Marland Kitchen  aspirin 325 MG tablet Take 325 mg by mouth daily.    . hydrocortisone (ANUSOL-HC) 2.5 % rectal cream Place rectally 2 (two) times daily.    Marland Kitchen lisinopril-hydrochlorothiazide (PRINZIDE,ZESTORETIC) 20-12.5 MG per tablet Take 1 tablet by mouth daily.    . Multiple Vitamin (MULTIVITAMIN) tablet Take 1 tablet by mouth daily.    Marland Kitchen zolpidem (AMBIEN) 10 MG tablet Take 10 mg by mouth at bedtime as needed.    . diphenhydrAMINE (SOMINEX) 25 MG tablet Take 25 mg by mouth at bedtime as needed for sleep.    Marland Kitchen HYDROcodone-homatropine (HYCODAN) 5-1.5 MG/5ML syrup Take 5 mLs by mouth every 6 (six) hours as needed for cough. (Patient not taking: Reported on 03/06/2018) 90 mL 0  . tadalafil (CIALIS) 20 MG tablet Take 20 mg by mouth daily as needed.     No current facility-administered medications for this encounter.     Physical Findings:  height is 5\' 5"  (1.651 m) and weight is 211 lb 9.6 oz (96 kg). His oral temperature is 98.1 F (36.7 C). His blood pressure is 160/76 (abnormal) and his pulse is 64. His respiration is 18 and oxygen saturation is 100%.  Pain Assessment Pain Score: 0-No pain/10 In general this is a well appearing African-American male in no acute distress.  He's alert and oriented x4 and appropriate throughout the examination. Cardiopulmonary assessment is negative for acute distress and he exhibits normal effort.  Lab Findings: No results found for: WBC, HGB, HCT, MCV, PLT   Radiographic Findings: No results found.  Impression/Plan: 1. 72 y.o. male with Stage T1c adenocarcinoma of the prostate with Gleason Score of 3+4, and PSA of 7.22.   He will continue to follow up with urology for ongoing PSA determinations and has an appointment scheduled with Dr. Pilar Jarvis in May 2019. He understands what to expect with regards to PSA monitoring going forward. I will look forward to following his response to treatment via correspondence with urology, and would be happy to continue to participate in his  care if clinically indicated. I talked to the patient about what to expect in the future, including his risk for erectile dysfunction and rectal bleeding. I encouraged him to call or return to the office if he has any questions regarding his previous radiation or possible radiation side effects. He was comfortable with this plan and will follow up as needed.    Nicholos Johns, PA-C

## 2018-04-10 DIAGNOSIS — C61 Malignant neoplasm of prostate: Secondary | ICD-10-CM | POA: Diagnosis not present

## 2018-04-23 DIAGNOSIS — Z8546 Personal history of malignant neoplasm of prostate: Secondary | ICD-10-CM | POA: Diagnosis not present

## 2018-07-03 DIAGNOSIS — I10 Essential (primary) hypertension: Secondary | ICD-10-CM | POA: Diagnosis not present

## 2018-07-03 DIAGNOSIS — E1165 Type 2 diabetes mellitus with hyperglycemia: Secondary | ICD-10-CM | POA: Diagnosis not present

## 2018-07-03 DIAGNOSIS — R413 Other amnesia: Secondary | ICD-10-CM | POA: Diagnosis not present

## 2018-07-03 LAB — HEPATIC FUNCTION PANEL
ALT: 25 (ref 10–40)
AST: 26 (ref 14–40)
Alkaline Phosphatase: 52 (ref 25–125)
Bilirubin, Total: 0.2

## 2018-07-03 LAB — LIPID PANEL
Cholesterol: 161 (ref 0–200)
HDL: 68 (ref 35–70)
LDL Cholesterol: 66
LDl/HDL Ratio: 1
Triglycerides: 133 (ref 40–160)

## 2018-07-03 LAB — BASIC METABOLIC PANEL
BUN: 16 (ref 4–21)
Creatinine: 1 (ref 0.6–1.3)
Creatinine: 1 (ref 0.6–1.3)
Glucose: 105
Potassium: 4.7 (ref 3.4–5.3)
Sodium: 144 (ref 137–147)

## 2018-07-03 LAB — VITAMIN B12: Vitamin B-12: 810

## 2018-07-03 LAB — TSH: TSH: 0.95 (ref 0.41–5.90)

## 2018-07-03 LAB — HEMOGLOBIN A1C: Hemoglobin A1C: 6.2

## 2018-07-08 DIAGNOSIS — Z23 Encounter for immunization: Secondary | ICD-10-CM | POA: Diagnosis not present

## 2018-07-20 DIAGNOSIS — R0989 Other specified symptoms and signs involving the circulatory and respiratory systems: Secondary | ICD-10-CM | POA: Diagnosis not present

## 2018-07-20 DIAGNOSIS — R05 Cough: Secondary | ICD-10-CM | POA: Diagnosis not present

## 2018-09-22 ENCOUNTER — Other Ambulatory Visit: Payer: Self-pay | Admitting: Internal Medicine

## 2018-09-22 NOTE — Telephone Encounter (Signed)
Zolpidem refill

## 2018-09-29 DIAGNOSIS — Z8546 Personal history of malignant neoplasm of prostate: Secondary | ICD-10-CM | POA: Diagnosis not present

## 2018-10-02 ENCOUNTER — Other Ambulatory Visit: Payer: Self-pay | Admitting: Internal Medicine

## 2018-10-06 DIAGNOSIS — R361 Hematospermia: Secondary | ICD-10-CM | POA: Diagnosis not present

## 2018-10-06 DIAGNOSIS — Z8546 Personal history of malignant neoplasm of prostate: Secondary | ICD-10-CM | POA: Diagnosis not present

## 2018-11-03 ENCOUNTER — Ambulatory Visit: Payer: Self-pay | Admitting: Internal Medicine

## 2018-11-11 ENCOUNTER — Encounter: Payer: Self-pay | Admitting: Internal Medicine

## 2018-11-11 ENCOUNTER — Ambulatory Visit: Payer: 59 | Admitting: Internal Medicine

## 2018-11-11 VITALS — BP 122/70 | HR 64 | Temp 98.4°F | Resp 98 | Ht 66.0 in | Wt 198.2 lb

## 2018-11-11 DIAGNOSIS — R202 Paresthesia of skin: Secondary | ICD-10-CM

## 2018-11-11 DIAGNOSIS — F5101 Primary insomnia: Secondary | ICD-10-CM

## 2018-11-11 DIAGNOSIS — B1920 Unspecified viral hepatitis C without hepatic coma: Secondary | ICD-10-CM

## 2018-11-11 DIAGNOSIS — I10 Essential (primary) hypertension: Secondary | ICD-10-CM | POA: Diagnosis not present

## 2018-11-11 DIAGNOSIS — E1165 Type 2 diabetes mellitus with hyperglycemia: Secondary | ICD-10-CM

## 2018-11-11 DIAGNOSIS — Z6831 Body mass index (BMI) 31.0-31.9, adult: Secondary | ICD-10-CM

## 2018-11-11 DIAGNOSIS — E6609 Other obesity due to excess calories: Secondary | ICD-10-CM

## 2018-11-11 MED ORDER — ALPRAZOLAM 0.25 MG PO TABS: 0.2500 mg | ORAL_TABLET | Freq: Every day | ORAL | 0 refills | Status: DC | PRN

## 2018-11-11 NOTE — Patient Instructions (Signed)
Diabetes Mellitus and Nutrition, Adult  When you have diabetes (diabetes mellitus), it is very important to have healthy eating habits because your blood sugar (glucose) levels are greatly affected by what you eat and drink. Eating healthy foods in the appropriate amounts, at about the same times every day, can help you:  · Control your blood glucose.  · Lower your risk of heart disease.  · Improve your blood pressure.  · Reach or maintain a healthy weight.  Every person with diabetes is different, and each person has different needs for a meal plan. Your health care provider may recommend that you work with a diet and nutrition specialist (dietitian) to make a meal plan that is best for you. Your meal plan may vary depending on factors such as:  · The calories you need.  · The medicines you take.  · Your weight.  · Your blood glucose, blood pressure, and cholesterol levels.  · Your activity level.  · Other health conditions you have, such as heart or kidney disease.  How do carbohydrates affect me?  Carbohydrates, also called carbs, affect your blood glucose level more than any other type of food. Eating carbs naturally raises the amount of glucose in your blood. Carb counting is a method for keeping track of how many carbs you eat. Counting carbs is important to keep your blood glucose at a healthy level, especially if you use insulin or take certain oral diabetes medicines.  It is important to know how many carbs you can safely have in each meal. This is different for every person. Your dietitian can help you calculate how many carbs you should have at each meal and for each snack.  Foods that contain carbs include:  · Bread, cereal, rice, pasta, and crackers.  · Potatoes and corn.  · Peas, beans, and lentils.  · Milk and yogurt.  · Fruit and juice.  · Desserts, such as cakes, cookies, ice cream, and candy.  How does alcohol affect me?  Alcohol can cause a sudden decrease in blood glucose (hypoglycemia),  especially if you use insulin or take certain oral diabetes medicines. Hypoglycemia can be a life-threatening condition. Symptoms of hypoglycemia (sleepiness, dizziness, and confusion) are similar to symptoms of having too much alcohol.  If your health care provider says that alcohol is safe for you, follow these guidelines:  · Limit alcohol intake to no more than 1 drink per day for nonpregnant women and 2 drinks per day for men. One drink equals 12 oz of beer, 5 oz of wine, or 1½ oz of hard liquor.  · Do not drink on an empty stomach.  · Keep yourself hydrated with water, diet soda, or unsweetened iced tea.  · Keep in mind that regular soda, juice, and other mixers may contain a lot of sugar and must be counted as carbs.  What are tips for following this plan?    Reading food labels  · Start by checking the serving size on the "Nutrition Facts" label of packaged foods and drinks. The amount of calories, carbs, fats, and other nutrients listed on the label is based on one serving of the item. Many items contain more than one serving per package.  · Check the total grams (g) of carbs in one serving. You can calculate the number of servings of carbs in one serving by dividing the total carbs by 15. For example, if a food has 30 g of total carbs, it would be equal to 2   servings of carbs.  · Check the number of grams (g) of saturated and trans fats in one serving. Choose foods that have low or no amount of these fats.  · Check the number of milligrams (mg) of salt (sodium) in one serving. Most people should limit total sodium intake to less than 2,300 mg per day.  · Always check the nutrition information of foods labeled as "low-fat" or "nonfat". These foods may be higher in added sugar or refined carbs and should be avoided.  · Talk to your dietitian to identify your daily goals for nutrients listed on the label.  Shopping  · Avoid buying canned, premade, or processed foods. These foods tend to be high in fat, sodium,  and added sugar.  · Shop around the outside edge of the grocery store. This includes fresh fruits and vegetables, bulk grains, fresh meats, and fresh dairy.  Cooking  · Use low-heat cooking methods, such as baking, instead of high-heat cooking methods like deep frying.  · Cook using healthy oils, such as olive, canola, or sunflower oil.  · Avoid cooking with butter, cream, or high-fat meats.  Meal planning  · Eat meals and snacks regularly, preferably at the same times every day. Avoid going long periods of time without eating.  · Eat foods high in fiber, such as fresh fruits, vegetables, beans, and whole grains. Talk to your dietitian about how many servings of carbs you can eat at each meal.  · Eat 4-6 ounces (oz) of lean protein each day, such as lean meat, chicken, fish, eggs, or tofu. One oz of lean protein is equal to:  ? 1 oz of meat, chicken, or fish.  ? 1 egg.  ? ¼ cup of tofu.  · Eat some foods each day that contain healthy fats, such as avocado, nuts, seeds, and fish.  Lifestyle  · Check your blood glucose regularly.  · Exercise regularly as told by your health care provider. This may include:  ? 150 minutes of moderate-intensity or vigorous-intensity exercise each week. This could be brisk walking, biking, or water aerobics.  ? Stretching and doing strength exercises, such as yoga or weightlifting, at least 2 times a week.  · Take medicines as told by your health care provider.  · Do not use any products that contain nicotine or tobacco, such as cigarettes and e-cigarettes. If you need help quitting, ask your health care provider.  · Work with a counselor or diabetes educator to identify strategies to manage stress and any emotional and social challenges.  Questions to ask a health care provider  · Do I need to meet with a diabetes educator?  · Do I need to meet with a dietitian?  · What number can I call if I have questions?  · When are the best times to check my blood glucose?  Where to find more  information:  · American Diabetes Association: diabetes.org  · Academy of Nutrition and Dietetics: www.eatright.org  · National Institute of Diabetes and Digestive and Kidney Diseases (NIH): www.niddk.nih.gov  Summary  · A healthy meal plan will help you control your blood glucose and maintain a healthy lifestyle.  · Working with a diet and nutrition specialist (dietitian) can help you make a meal plan that is best for you.  · Keep in mind that carbohydrates (carbs) and alcohol have immediate effects on your blood glucose levels. It is important to count carbs and to use alcohol carefully.  This information is not intended to   replace advice given to you by your health care provider. Make sure you discuss any questions you have with your health care provider.  Document Released: 08/08/2005 Document Revised: 06/11/2017 Document Reviewed: 12/16/2016  Elsevier Interactive Patient Education © 2019 Elsevier Inc.

## 2018-11-12 LAB — CMP14+EGFR
A/G RATIO: 1.6 (ref 1.2–2.2)
ALBUMIN: 4.6 g/dL (ref 3.5–4.8)
ALT: 17 IU/L (ref 0–44)
AST: 21 IU/L (ref 0–40)
Alkaline Phosphatase: 43 IU/L (ref 39–117)
BUN / CREAT RATIO: 12 (ref 10–24)
BUN: 13 mg/dL (ref 8–27)
Bilirubin Total: 0.6 mg/dL (ref 0.0–1.2)
CALCIUM: 10 mg/dL (ref 8.6–10.2)
CO2: 25 mmol/L (ref 20–29)
Chloride: 102 mmol/L (ref 96–106)
Creatinine, Ser: 1.1 mg/dL (ref 0.76–1.27)
GFR calc Af Amer: 77 mL/min/{1.73_m2} (ref >59)
GFR, EST NON AFRICAN AMERICAN: 66 mL/min/{1.73_m2} (ref >59)
Globulin, Total: 2.8 g/dL (ref 1.5–4.5)
Glucose: 108 mg/dL — ABNORMAL HIGH (ref 65–99)
POTASSIUM: 4.6 mmol/L (ref 3.5–5.2)
Sodium: 141 mmol/L (ref 134–144)
TOTAL PROTEIN: 7.4 g/dL (ref 6.0–8.5)

## 2018-11-12 LAB — HEPATITIS C ANTIBODY: HEP C VIRUS AB: 7.4 {s_co_ratio} — AB (ref 0.0–0.9)

## 2018-11-12 LAB — URIC ACID: Uric Acid: 5.5 mg/dL (ref 3.7–8.6)

## 2018-11-12 LAB — HEMOGLOBIN A1C
Est. average glucose Bld gHb Est-mCnc: 123 mg/dL
Hgb A1c MFr Bld: 5.9 % — ABNORMAL HIGH (ref 4.8–5.6)

## 2018-11-12 NOTE — Progress Notes (Signed)
pls abstract last lipid/a1c and bmp (include GFR).  Liver and kidney fxn are stable. Your hba1c is 5.9 .kw-pls add on HCV_RNA viral load. Dx: hepatitis C virus. Let him know he appears to have hepatitis C. I will add on additional bloodwork. Is he aware of a previous diagnosis of this? If yes, did he receive treatment?

## 2018-11-13 ENCOUNTER — Other Ambulatory Visit: Payer: Self-pay

## 2018-11-13 ENCOUNTER — Encounter: Payer: Self-pay | Admitting: Internal Medicine

## 2018-11-13 ENCOUNTER — Telehealth: Payer: Self-pay

## 2018-11-13 ENCOUNTER — Other Ambulatory Visit: Payer: Self-pay | Admitting: Internal Medicine

## 2018-11-13 DIAGNOSIS — B1920 Unspecified viral hepatitis C without hepatic coma: Secondary | ICD-10-CM

## 2018-11-13 NOTE — Telephone Encounter (Signed)
The pt was told that he needed to return to the office for additional lab work because it's a frozen test and it can't be added to what he had drawn the day of his appt. The pt said that he would come today at 2 pm.

## 2018-11-20 LAB — HCV RNA QN (GRAPH) RFX NS3/4A: Hepatitis C Quantitation: NOT DETECTED IU/mL

## 2018-11-23 DIAGNOSIS — I10 Essential (primary) hypertension: Secondary | ICD-10-CM | POA: Insufficient documentation

## 2018-11-23 DIAGNOSIS — F5101 Primary insomnia: Secondary | ICD-10-CM | POA: Insufficient documentation

## 2018-11-23 DIAGNOSIS — E1165 Type 2 diabetes mellitus with hyperglycemia: Secondary | ICD-10-CM | POA: Insufficient documentation

## 2018-11-23 DIAGNOSIS — Z6831 Body mass index (BMI) 31.0-31.9, adult: Secondary | ICD-10-CM

## 2018-11-23 DIAGNOSIS — E6609 Other obesity due to excess calories: Secondary | ICD-10-CM | POA: Insufficient documentation

## 2018-11-23 NOTE — Progress Notes (Signed)
Subjective:     Patient ID: Matthew Caldwell , male    DOB: 1946/02/19 , 72 y.o.   MRN: [id number]   Chief Complaint  Patient presents with  . Diabetes  . Hypertension    HPI  Diabetes  He presents for his follow-up diabetic visit. He has type 2 diabetes mellitus. His disease course has been stable. There are no hypoglycemic associated symptoms. Pertinent negatives for diabetes include no blurred vision and no chest pain. There are no hypoglycemic complications. Risk factors for coronary artery disease include diabetes mellitus, hypertension, male sex and obesity.  Hypertension  This is a chronic problem. The current episode started more than 1 year ago. The problem has been gradually improving since onset. The problem is controlled. Pertinent negatives include no blurred vision, chest pain, palpitations or shortness of breath.   He reports compliance with meds.   Past Medical History:  Diagnosis Date  . Asthma   . Hypertension   . Prostate cancer [facility])      Family History  Problem Relation Age of Onset  . Diabetes Mother   . Asthma Father   . Cancer Neg Hx      Current Outpatient Medications:  .  albuterol (PROVENTIL HFA;VENTOLIN HFA) 108 (90 Base) MCG/ACT inhaler, Inhale 2 puffs into the lungs every 6 (six) hours as needed for wheezing or shortness of breath., Disp: 1 Inhaler, Rfl: 0 .  ALPRAZolam (XANAX) 0.25 MG tablet, Take 1 tablet (0.25 mg total) by mouth daily as needed., Disp: 30 tablet, Rfl: 0 .  aspirin 325 MG tablet, Take 325 mg by mouth daily., Disp: , Rfl:  .  diphenhydrAMINE (SOMINEX) 25 MG tablet, Take 25 mg by mouth at bedtime as needed for sleep., Disp: , Rfl:  .  lisinopril-hydrochlorothiazide (PRINZIDE,ZESTORETIC) 20-12.5 MG per tablet, Take 1 tablet by mouth daily., Disp: , Rfl:  .  Multiple Vitamin (MULTIVITAMIN) tablet, Take 1 tablet by mouth daily., Disp: , Rfl:  .  zolpidem (AMBIEN) 10 MG tablet, Take 1/2 to 1 tablet nightly prn, Disp: 30 tablet, Rfl:  1 .  HYDROcodone-homatropine (HYCODAN) 5-1.5 MG/5ML syrup, Take 5 mLs by mouth every 6 (six) hours as needed for cough. (Patient not taking: Reported on 03/06/2018), Disp: 90 mL, Rfl: 0 .  tadalafil (CIALIS) 20 MG tablet, Take 20 mg by mouth daily as needed., Disp: , Rfl:    Allergies  Allergen Reactions  . Penicillins Rash     Review of Systems  Constitutional: Negative.   Eyes: Negative for blurred vision.  Respiratory: Negative.  Negative for shortness of breath.   Cardiovascular: Negative.  Negative for chest pain and palpitations.  Gastrointestinal: Negative.   Neurological: Positive for numbness (he c/o numbness of left foot. thinks it is due to gout flare. no weakness. ).  Psychiatric/Behavioral: Positive for sleep disturbance.     Today's Vitals   11/11/18 1408  BP: 122/70  Pulse: 64  Resp: (!) 98  Temp: 98.4 F (36.9 C)  TempSrc: Oral  Weight: 198 lb 3.2 oz (89.9 kg)  Height: _0  (1.676 m)  PainSc: 0-No pain   Body mass index is 31.99 kg/m.   Objective:  Physical Exam Vitals signs and nursing note reviewed.  Constitutional:      Appearance: Normal appearance. He is obese.  HENT:     Head: Normocephalic and atraumatic.  Cardiovascular:     Rate and Rhythm: Normal rate and regular rhythm.     Heart sounds: Normal heart sounds.  Pulmonary:  Effort: Pulmonary effort is normal.     Breath sounds: Normal breath sounds.  Skin:    General: Skin is warm.  Neurological:     General: No focal deficit present.     Mental Status: He is alert.  Psychiatric:        Mood and Affect: Mood normal.         Assessment And Plan:     1. Uncontrolled type 2 diabetes mellitus with hyperglycemia ([place])  I will check labs as listed below. Importance of regular exercise was discussed with the patient.   - CMP14+EGFR - Hemoglobin A1c - Hepatitis C antibody  2. Essential hypertension, benign  Well controlled. He will continue with current meds. He is encouraged to  avoid adding salt to his foods.   3. Paresthesia of left foot  He thinks this sx is related to his gout. I will check uric acid as requested. Pt advised that his sx could also be due to lumbar disc disease vs. Diabetic neuropathy (although unusual to be unilateral). I will make further recommendations once his labs are available for review.   - Uric acid  4. Primary insomnia  Chronic. He was given rx ambien 67m to use nightly as needed. Unfortunately, other agents have been ineffective for him in the past. He is also encouraged to establish a bedtime routine.   5. Class 1 obesity due to excess calories with serious comorbidity and body mass index (BMI) of 31.0 to 31.9 in adult  He is encouraged to strive for BMI less than 28 to decrease cardiac risk. He is encouraged to exercise 30 minutes five days weekly.     RMaximino Greenland MD

## 2018-11-27 ENCOUNTER — Other Ambulatory Visit: Payer: Self-pay

## 2018-11-27 MED ORDER — ALBUTEROL SULFATE HFA 108 (90 BASE) MCG/ACT IN AERS: 2.0000 | INHALATION_SPRAY | Freq: Four times a day (QID) | RESPIRATORY_TRACT | 0 refills | Status: DC | PRN

## 2018-11-30 ENCOUNTER — Other Ambulatory Visit: Payer: Self-pay

## 2018-11-30 MED ORDER — ALBUTEROL SULFATE HFA 108 (90 BASE) MCG/ACT IN AERS: 2.0000 | INHALATION_SPRAY | Freq: Four times a day (QID) | RESPIRATORY_TRACT | 0 refills | Status: DC | PRN

## 2018-12-01 DIAGNOSIS — E119 Type 2 diabetes mellitus without complications: Secondary | ICD-10-CM | POA: Diagnosis not present

## 2018-12-01 DIAGNOSIS — H40053 Ocular hypertension, bilateral: Secondary | ICD-10-CM | POA: Diagnosis not present

## 2018-12-01 DIAGNOSIS — H17821 Peripheral opacity of cornea, right eye: Secondary | ICD-10-CM | POA: Diagnosis not present

## 2018-12-01 LAB — HM DIABETES EYE EXAM

## 2018-12-03 ENCOUNTER — Ambulatory Visit (HOSPITAL_COMMUNITY)
Admission: EM | Admit: 2018-12-03 | Discharge: 2018-12-03 | Disposition: A | Discharge status: Home / Self Care | Payer: 59 | Attending: Family Medicine | Admitting: Family Medicine

## 2018-12-03 ENCOUNTER — Other Ambulatory Visit: Payer: Self-pay

## 2018-12-03 ENCOUNTER — Encounter (HOSPITAL_COMMUNITY): Payer: Self-pay

## 2018-12-03 DIAGNOSIS — J069 Acute upper respiratory infection, unspecified: Secondary | ICD-10-CM | POA: Insufficient documentation

## 2018-12-03 DIAGNOSIS — B9789 Other viral agents as the cause of diseases classified elsewhere: Secondary | ICD-10-CM

## 2018-12-03 MED ORDER — GUAIFENESIN ER 600 MG PO TB12: 600.0000 mg | ORAL_TABLET | Freq: Two times a day (BID) | ORAL | 0 refills | Status: DC

## 2018-12-03 MED ORDER — BENZONATATE 100 MG PO CAPS: 100.0000 mg | ORAL_CAPSULE | Freq: Three times a day (TID) | ORAL | 0 refills | Status: DC

## 2018-12-03 NOTE — Discharge Instructions (Addendum)
This is a viral upper respiratory infection Mucinex for cough, congestion Tessalon Perles for worsening cough Follow up as needed for continued or worsening symptoms

## 2018-12-03 NOTE — ED Triage Notes (Signed)
Pt cc cough and chest congestion x 2 days.

## 2018-12-03 NOTE — ED Provider Notes (Signed)
[place]    CSN: [id number] Arrival date & time: 12/03/18  1517     History   Chief Complaint Chief Complaint  Patient presents with  . Cough    HPI Matthew Caldwell is a 73 y.o. male.   Patient is a 72 year old male with past medical history of asthma, hypertension, diabetes, prostate cancer.  He presents with 1 day of cough, congestion, runny nose.  Symptoms have been constant and remain the same.  He took TheraFlu last night for symptoms.  He denies any associated chest pain, shortness of breath.  No wheezing.  Denies any fever, chills, night sweats. No recent sick contacts.   ROS per HPI      Past Medical History:  Diagnosis Date  . Asthma   . Hypertension   . Prostate cancer [facility])     Patient Active Problem List   Diagnosis Date Noted  . Uncontrolled type 2 diabetes mellitus with hyperglycemia ([place]) 11/23/2018  . Essential hypertension, benign 11/23/2018  . Primary insomnia 11/23/2018  . Class 1 obesity due to excess calories with serious comorbidity and body mass index (BMI) of 31.0 to 31.9 in adult 11/23/2018  . Malignant neoplasm of prostate ([place]) 09/24/2017    Past Surgical History:  Procedure Laterality Date  . MOUTH SURGERY    . PROSTATE BIOPSY         Home Medications    Prior to Admission medications   Medication Sig Start Date End Date Taking? Authorizing Provider  albuterol (PROVENTIL HFA;VENTOLIN HFA) 108 (90 Base) MCG/ACT inhaler Inhale 2 puffs into the lungs every 6 (six) hours as needed for wheezing or shortness of breath. 11/30/18   Glendale Chard, MD  ALPRAZolam Duanne Moron) 0.25 MG tablet Take 1 tablet (0.25 mg total) by mouth daily as needed. 11/11/18   Glendale Chard, MD  aspirin 325 MG tablet Take 325 mg by mouth daily.    [provider]  benzonatate (TESSALON) 100 MG capsule Take 1 capsule (100 mg total) by mouth every 8 (eight) hours. 12/03/18   Loura Halt A, NP  diphenhydrAMINE (SOMINEX) 25 MG tablet Take 25 mg by  mouth at bedtime as needed for sleep.    [provider]  guaiFENesin (MUCINEX) 600 MG 12 hr tablet Take 1 tablet (600 mg total) by mouth 2 (two) times daily. 12/03/18   Ikechukwu Cerny, Tressia Miners A, NP  HYDROcodone-homatropine (HYCODAN) 5-1.5 MG/5ML syrup Take 5 mLs by mouth every 6 (six) hours as needed for cough. Patient not taking: Reported on 03/06/2018 02/03/18   Vanessa Kick, MD  lisinopril-hydrochlorothiazide (PRINZIDE,ZESTORETIC) 20-12.5 MG per tablet Take 1 tablet by mouth daily.    [provider]  Multiple Vitamin (MULTIVITAMIN) tablet Take 1 tablet by mouth daily.    [provider]  tadalafil (CIALIS) 20 MG tablet Take 20 mg by mouth daily as needed.    [provider]  zolpidem (AMBIEN) 10 MG tablet Take 1/2 to 1 tablet nightly prn 09/22/18   Glendale Chard, MD    Family History Family History  Problem Relation Age of Onset  . Diabetes Mother   . Asthma Father   . Cancer Neg Hx     Social History Social History   Tobacco Use  . Smoking status: Former Smoker    Packs/day: 0.25    Years: 1.00    Pack years: 0.25    Types: Cigarettes    Last attempt to quit: 11/25/1997    Years since quitting: 21.0  . Smokeless tobacco: Never  Used  Substance Use Topics  . Alcohol use: Yes    Comment: 3 a week  . Drug use: No     Allergies   Penicillins   Review of Systems Review of Systems   Physical Exam Triage Vital Signs ED Triage Vitals  Enc Vitals Group     BP 12/03/18 0930 133/73     Pulse Rate 12/03/18 0930 81     Resp 12/03/18 0930 18     Temp 12/03/18 0930 98.8 F (37.1 C)     Temp Source 12/03/18 0930 Oral     SpO2 12/03/18 0930 95 %     Weight 12/03/18 0956 200 lb (90.7 kg)     Height --      Head Circumference --      Peak Flow --      Pain Score 12/03/18 0956 4     Pain Loc --      Pain Edu? --      Excl. in [place]? --    No data found.  Updated Vital Signs BP 133/73 (BP Location: Left Arm)   Pulse 81   Temp 98.8 F (37.1 C)  (Oral)   Resp 18   Wt 200 lb (90.7 kg)   SpO2 95%   BMI 32.28 kg/m   Visual Acuity Right Eye Distance:   Left Eye Distance:   Bilateral Distance:    Right Eye Near:   Left Eye Near:    Bilateral Near:     Physical Exam Vitals signs and nursing note reviewed.  Constitutional:      Appearance: Normal appearance. He is not ill-appearing or toxic-appearing.  HENT:     Head: Normocephalic and atraumatic.     Right Ear: Tympanic membrane, ear canal and external ear normal.     Left Ear: Tympanic membrane, ear canal and external ear normal.     Nose: Congestion and rhinorrhea present.     Mouth/Throat:     Pharynx: Oropharynx is clear.  Eyes:     Conjunctiva/sclera: Conjunctivae normal.  Neck:     Musculoskeletal: Normal range of motion.  Cardiovascular:     Rate and Rhythm: Normal rate and regular rhythm.     Heart sounds: Normal heart sounds.  Pulmonary:     Effort: Pulmonary effort is normal.     Breath sounds: Normal breath sounds.  Musculoskeletal: Normal range of motion.  Skin:    General: Skin is warm and dry.  Neurological:     Mental Status: He is alert.  Psychiatric:        Mood and Affect: Mood normal.      UC Treatments / Results  Labs (all labs ordered are listed, but only abnormal results are displayed) Labs Reviewed - No data to display  EKG None  Radiology No results found.  Procedures Procedures (including critical care time)  Medications Ordered in UC Medications - No data to display  Initial Impression / Assessment and Plan / UC Course  I have reviewed the triage vital signs and the nursing notes.  Pertinent labs & imaging results that were available during my care of the patient were reviewed by me and considered in my medical decision making (see chart for details).     Viral URI Symptomatic treatment Follow up as needed for continued or worsening symptoms  Final Clinical Impressions(s) / UC Diagnoses   Final diagnoses:    Viral URI with cough     Discharge Instructions     This  is a viral upper respiratory infection Mucinex for cough, congestion Tessalon Perles for worsening cough Follow up as needed for continued or worsening symptoms     ED Prescriptions    Medication Sig Dispense Auth. Provider   benzonatate (TESSALON) 100 MG capsule Take 1 capsule (100 mg total) by mouth every 8 (eight) hours. 21 capsule Cathlin Buchan A, NP   guaiFENesin (MUCINEX) 600 MG 12 hr tablet Take 1 tablet (600 mg total) by mouth 2 (two) times daily. 15 tablet Loura Halt A, NP     Controlled Substance Prescriptions [place] Controlled Substance Registry consulted? Not Applicable   Orvan July, NP 12/03/18 1003

## 2018-12-11 ENCOUNTER — Other Ambulatory Visit: Payer: Self-pay

## 2018-12-11 MED ORDER — IPRATROPIUM BROMIDE 0.03 % NA SOLN: 2.0000 | Freq: Two times a day (BID) | NASAL | 2 refills | Status: DC

## 2018-12-16 ENCOUNTER — Other Ambulatory Visit: Payer: Self-pay

## 2018-12-16 ENCOUNTER — Encounter: Payer: Self-pay | Admitting: Internal Medicine

## 2018-12-16 ENCOUNTER — Ambulatory Visit: Payer: 59 | Admitting: Internal Medicine

## 2018-12-16 ENCOUNTER — Ambulatory Visit
Admission: RE | Admit: 2018-12-16 | Discharge: 2018-12-16 | Disposition: A | Discharge status: Home / Self Care | Payer: 59 | Source: Ambulatory Visit | Attending: Internal Medicine | Admitting: Internal Medicine

## 2018-12-16 VITALS — BP 150/84 | HR 52 | Temp 98.6°F | Resp 18 | Ht 66.0 in | Wt 202.8 lb

## 2018-12-16 DIAGNOSIS — R059 Cough, unspecified: Secondary | ICD-10-CM

## 2018-12-16 DIAGNOSIS — Z0100 Encounter for examination of eyes and vision without abnormal findings: Secondary | ICD-10-CM

## 2018-12-16 DIAGNOSIS — E119 Type 2 diabetes mellitus without complications: Secondary | ICD-10-CM

## 2018-12-16 DIAGNOSIS — R062 Wheezing: Secondary | ICD-10-CM

## 2018-12-16 DIAGNOSIS — Z1211 Encounter for screening for malignant neoplasm of colon: Secondary | ICD-10-CM

## 2018-12-16 DIAGNOSIS — R05 Cough: Secondary | ICD-10-CM

## 2018-12-16 DIAGNOSIS — J4521 Mild intermittent asthma with (acute) exacerbation: Secondary | ICD-10-CM

## 2018-12-16 DIAGNOSIS — R030 Elevated blood-pressure reading, without diagnosis of hypertension: Secondary | ICD-10-CM

## 2018-12-16 DIAGNOSIS — E1165 Type 2 diabetes mellitus with hyperglycemia: Secondary | ICD-10-CM | POA: Diagnosis not present

## 2018-12-16 MED ORDER — IPRATROPIUM-ALBUTEROL 0.5-2.5 (3) MG/3ML IN SOLN
3.0000 mL | Freq: Once | RESPIRATORY_TRACT | Status: AC
  Administered 2018-12-16: 3 mL via RESPIRATORY_TRACT

## 2018-12-16 MED ORDER — PREDNISONE 20 MG PO TABS: 20.0000 mg | ORAL_TABLET | Freq: Every day | ORAL | 0 refills | Status: DC

## 2018-12-16 NOTE — Progress Notes (Addendum)
Subjective:     Patient ID: Matthew Caldwell , male    DOB: 10/26/1946 , 73 y.o.   MRN: [id number]   Chief Complaint  Patient presents with  . URI     X 10 DAYS WITH COUGHING, PRODUCTIVE COUGH WITH WHITE PHLEGM, TRIED MUCINEX DIDN'T HELP WITH COUGH, NO FEVERS, DECREASED APPETITE,COLD CHILLS.  PT RECEIVED FLU VACCINE 06/2018    HPI Onset of URI 10 days ago and was seen at urgent care. Cough is productive with clear mucous. Has had chills a few times, but no fever. Gets occasional sweats. Has heard himself wheezing.  Last time he used his inhaler 3 days ago. He tries to not use his inhaler too much " my mother tells me my  uncle died because he used his inhaler too much." He is over due for his colonoscopy and he prefers having a cologuard.  Past Medical History:  Diagnosis Date  . Asthma   . Hypertension   . Prostate cancer [facility])      Family History  Problem Relation Age of Onset  . Diabetes Mother   . Asthma Father   . Cancer Neg Hx      Current Outpatient Medications:  .  albuterol (PROVENTIL HFA;VENTOLIN HFA) 108 (90 Base) MCG/ACT inhaler, Inhale 2 puffs into the lungs every 6 (six) hours as needed for wheezing or shortness of breath., Disp: 1 Inhaler, Rfl: 0 .  ALPRAZolam (XANAX) 0.25 MG tablet, Take 1 tablet (0.25 mg total) by mouth daily as needed., Disp: 30 tablet, Rfl: 0 .  aspirin 325 MG tablet, Take 325 mg by mouth daily., Disp: , Rfl:  .  benzonatate (TESSALON) 100 MG capsule, Take 1 capsule (100 mg total) by mouth every 8 (eight) hours., Disp: 21 capsule, Rfl: 0 .  diphenhydrAMINE (SOMINEX) 25 MG tablet, Take 25 mg by mouth at bedtime as needed for sleep., Disp: , Rfl:  .  guaiFENesin (MUCINEX) 600 MG 12 hr tablet, Take 1 tablet (600 mg total) by mouth 2 (two) times daily., Disp: 15 tablet, Rfl: 0 .  ipratropium (ATROVENT) 0.03 % nasal spray, Place 2 sprays into both nostrils every 12 (twelve) hours., Disp: 30 mL, Rfl: 2 .  lisinopril-hydrochlorothiazide  (PRINZIDE,ZESTORETIC) 20-12.5 MG per tablet, Take 1 tablet by mouth daily., Disp: , Rfl:  .  Multiple Vitamin (MULTIVITAMIN) tablet, Take 1 tablet by mouth daily., Disp: , Rfl:  .  zolpidem (AMBIEN) 10 MG tablet, Take 1/2 to 1 tablet nightly prn, Disp: 30 tablet, Rfl: 1 .  HYDROcodone-homatropine (HYCODAN) 5-1.5 MG/5ML syrup, Take 5 mLs by mouth every 6 (six) hours as needed for cough. (Patient not taking: Reported on 03/06/2018), Disp: 90 mL, Rfl: 0 .  tadalafil (CIALIS) 20 MG tablet, Take 20 mg by mouth daily as needed., Disp: , Rfl:    Allergies  Allergen Reactions  . Penicillins Rash     Review of Systems  Constitutional: Positive for chills and diaphoresis. Negative for fatigue and fever.  HENT: Positive for postnasal drip. Negative for congestion, ear discharge, ear pain, rhinorrhea, sinus pressure, sinus pain and sore throat.   Eyes: Negative for discharge.  Respiratory: Positive for cough and wheezing. Negative for chest tightness, shortness of breath and stridor.   Cardiovascular: Negative for chest pain and palpitations.       Has white coat HTN  Gastrointestinal: Negative for nausea and vomiting.  Musculoskeletal: Negative for myalgias.  Skin: Negative for rash.  Neurological: Negative for headaches.  Hematological: Negative for adenopathy.  Today's Vitals   12/16/18 0853  BP: (!) 150/84  Pulse: (!) 52  Resp: 18  Temp: 98.6 F (37 C)  TempSrc: Oral  SpO2: 98%  Weight: 202 lb 12.8 oz (92 kg)  Height: 5\' 6"  (1.676 m)   Body mass index is 32.73 kg/m.   Objective:  Physical Exam Vitals signs and nursing note reviewed.  Constitutional:      General: He is not in acute distress.    Appearance: Normal appearance. He is not toxic-appearing.  HENT:     Head: Normocephalic.     Right Ear: Tympanic membrane, ear canal and external ear normal.     Left Ear: Tympanic membrane, ear canal and external ear normal.     Nose: Nose normal. No congestion or rhinorrhea.      Mouth/Throat:     Mouth: Mucous membranes are moist.     Pharynx: Oropharynx is clear.  Eyes:     General: No scleral icterus.    Conjunctiva/sclera: Conjunctivae normal.  Neck:     Musculoskeletal: Neck supple. No neck rigidity.  Cardiovascular:     Rate and Rhythm: Normal rate and regular rhythm.     Heart sounds: No murmur.  Pulmonary:     Effort: Pulmonary effort is normal.     Breath sounds: Wheezing present. No rhonchi or rales.     Comments: Has faint expiratory wheezing on L upper and mild lung Musculoskeletal: Normal range of motion.  Lymphadenopathy:     Cervical: No cervical adenopathy.  Skin:    General: Skin is warm and dry.  Neurological:     Mental Status: He is alert and oriented to person, place, and time.  Psychiatric:        Mood and Affect: Mood normal.        Behavior: Behavior normal.        Thought Content: Thought content normal.        Judgment: Judgment normal.     Assessment And Plan:     1. Encounter for screening colonoscopy- screen - Cologuard  2. Type 2 diabetes mellitus with hyperglycemia, without long-term current use of insulin ([place])- screen - [facility] Diabetes Foot Exam  3. Diabetic eye exam ([place])- due to have eye xm this year.    4- Asthma exacerbation- acute. Neb treatment was given. I sent him to get a chest xray and we will call him with results when this is back. In the mean time I educated him how to use his inhaler when  He gets coughs from colds or allergies and he is to use it q 4h for 5 days. I also placed him on Prednisone for 5 days. See instructions. Fu if he gets worse.   Jazminn Pomales RODRIGUEZ-SOUTHWORTH, PA-C

## 2018-12-16 NOTE — Patient Instructions (Addendum)
  Any time you have allergies or a cold and you have more cough, use your inhaler 2 puffs every 4 hours for 3-5 days, then only as needed after that.  Also discontinue any dairy during that time, since this makes the mucous thicker.   I don't find any signs of infection, just flair of your asthma from the cold you had, but I will send you to get a chest xray, to make sure. In the mean time  I would like you to use your inhaler 2 puffs every 4 hours while you are awake for 5 days and come back for follow up next week. And I will place you on prednisone to help with the bronchi inflammation. Monitor your sugar since the prednisone can increase  The sugar number.   If you have lab work done today you will be contacted with your lab results within the next 2 weeks.  If you have not heard from [place] then please contact us. The fastest way to get your results is to register for My Chart.   IF you received an x-ray today, you will receive an invoice from [facility]. Please contact [facility] at [phone] with questions or concerns regarding your invoice.   IF you received labwork today, you will receive an invoice from [place]. Please contact [facility] at [phone] with questions or concerns regarding your invoice.   Our billing staff will not be able to assist you with questions regarding bills from these companies.  You will be contacted with the lab results as soon as they are available. The fastest way to get your results is to activate your My Chart account. Instructions are located on the last page of this paperwork. If you have not heard from [place] regarding the results in 2 weeks, please contact this office.

## 2018-12-22 ENCOUNTER — Ambulatory Visit: Payer: 59 | Admitting: Internal Medicine

## 2018-12-23 ENCOUNTER — Ambulatory Visit: Payer: 59 | Admitting: Internal Medicine

## 2018-12-25 ENCOUNTER — Encounter: Payer: Self-pay | Admitting: Internal Medicine

## 2019-01-08 ENCOUNTER — Telehealth: Payer: Self-pay | Admitting: Internal Medicine

## 2019-01-08 ENCOUNTER — Other Ambulatory Visit: Payer: Self-pay | Admitting: Internal Medicine

## 2019-01-08 MED ORDER — ZOLPIDEM TARTRATE 10 MG PO TABS: ORAL_TABLET | ORAL | 1 refills | Status: DC

## 2019-01-08 NOTE — Telephone Encounter (Signed)
PT REQ REFILL OF AMBIEN

## 2019-01-22 IMAGING — DX DG CHEST 2V
2 series · 2 of 2 positions shown · non-contrast
Comparison: 11/21/2016

CLINICAL DATA: Productive cough and wheezing for 10 days.

EXAM:
CHEST - 2 VIEW

[dg chest 2 view (1 of 2)]
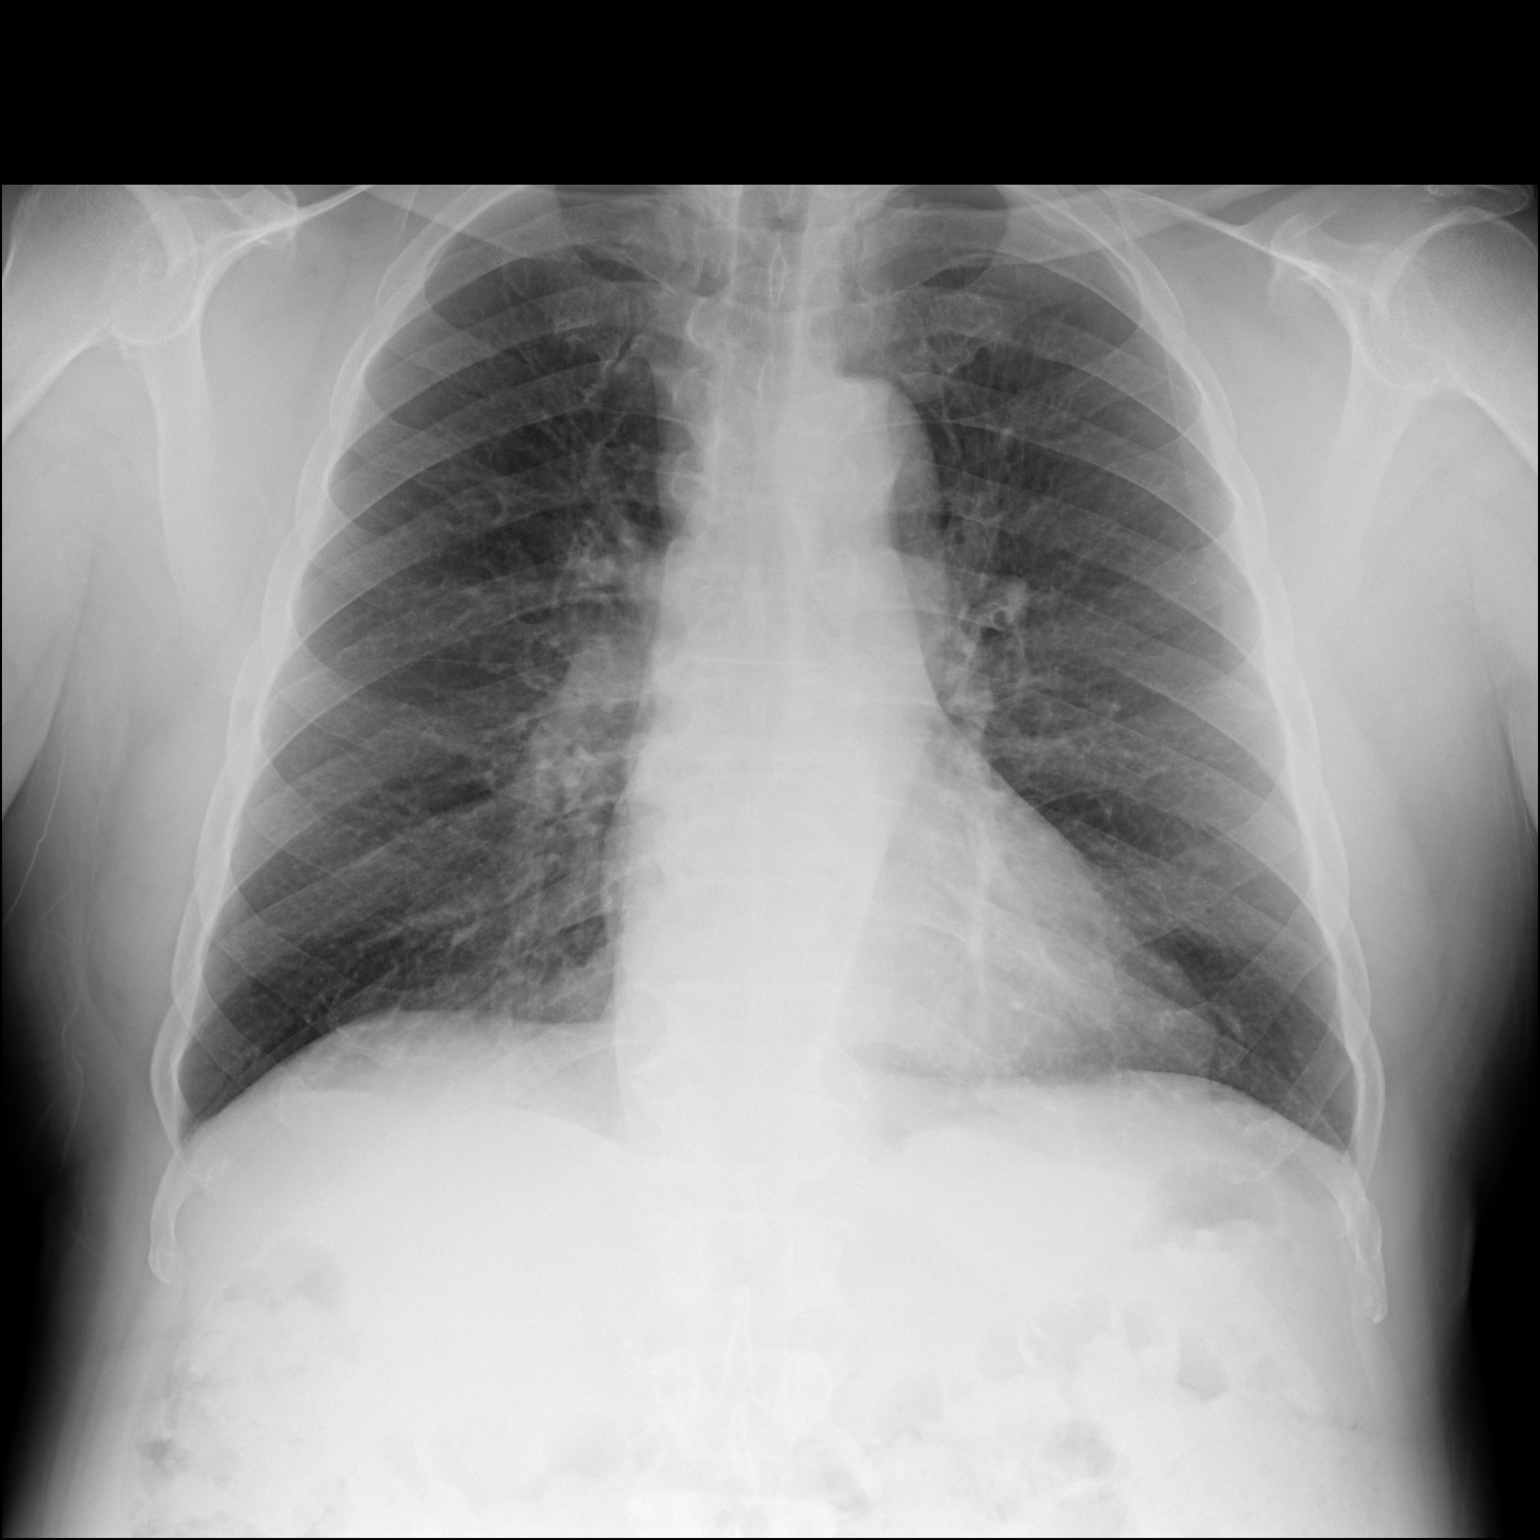

[dg chest 2 view (2 of 2)]
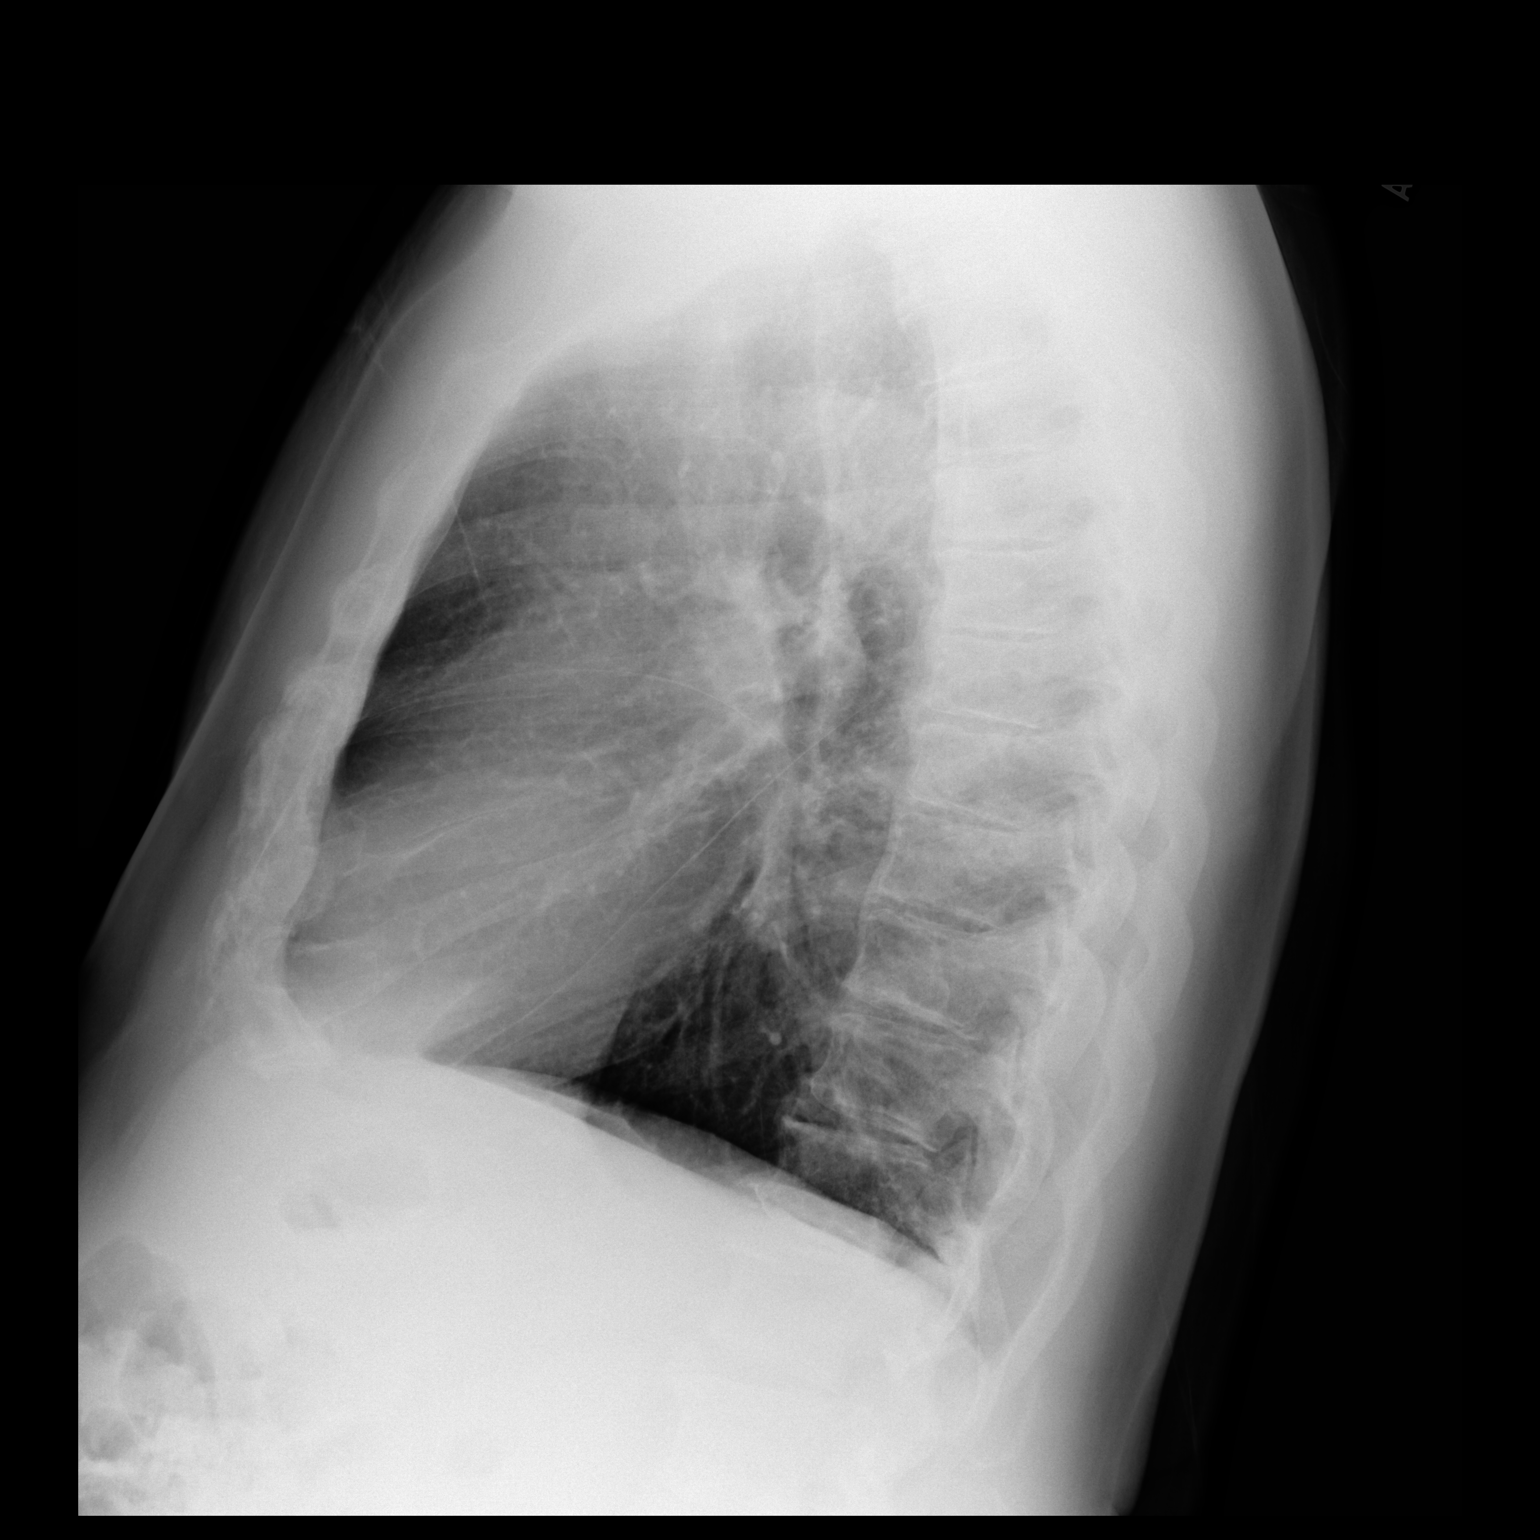

[2 of 2 positions shown; findings below may reference images not displayed]

FINDINGS: The cardiomediastinal silhouette is unchanged with normal heart
size. Aortic atherosclerosis is noted. The lungs are hyperinflated
with mild chronic peribronchial thickening. No acute airspace
consolidation, edema, pleural effusion, or pneumothorax is
identified. No acute osseous abnormality is seen.
IMPRESSION: Chronic bronchitic changes without evidence of active
cardiopulmonary disease.

## 2019-03-08 ENCOUNTER — Encounter: Payer: Self-pay | Admitting: Internal Medicine

## 2019-03-11 ENCOUNTER — Telehealth: Payer: Self-pay

## 2019-03-11 NOTE — Telephone Encounter (Signed)
Left pt v/m to call office so we can remind him to complete his kit for the cologuard. YRL,RMA

## 2019-03-15 ENCOUNTER — Other Ambulatory Visit: Payer: Self-pay | Admitting: Internal Medicine

## 2019-04-08 ENCOUNTER — Other Ambulatory Visit: Payer: Self-pay | Admitting: Internal Medicine

## 2019-04-08 MED ORDER — LISINOPRIL-HYDROCHLOROTHIAZIDE 20-12.5 MG PO TABS: 1.0000 | ORAL_TABLET | Freq: Every day | ORAL | 0 refills | Status: DC

## 2019-04-08 NOTE — Telephone Encounter (Signed)
Zolpidem refill

## 2019-05-24 ENCOUNTER — Other Ambulatory Visit: Payer: Self-pay | Admitting: Internal Medicine

## 2019-06-15 ENCOUNTER — Other Ambulatory Visit: Payer: Self-pay | Admitting: Internal Medicine

## 2019-06-15 NOTE — Telephone Encounter (Signed)
Alprazolam refill 

## 2019-06-21 ENCOUNTER — Other Ambulatory Visit: Payer: Self-pay

## 2019-06-21 ENCOUNTER — Encounter: Payer: Self-pay | Admitting: Internal Medicine

## 2019-06-21 ENCOUNTER — Ambulatory Visit: Payer: 59 | Admitting: Internal Medicine

## 2019-06-21 VITALS — Temp 98.3°F | Ht 65.4 in | Wt 196.0 lb

## 2019-06-21 DIAGNOSIS — I10 Essential (primary) hypertension: Secondary | ICD-10-CM | POA: Diagnosis not present

## 2019-06-21 DIAGNOSIS — Z1211 Encounter for screening for malignant neoplasm of colon: Secondary | ICD-10-CM

## 2019-06-21 DIAGNOSIS — Z0000 Encounter for general adult medical examination without abnormal findings: Secondary | ICD-10-CM

## 2019-06-21 DIAGNOSIS — F5101 Primary insomnia: Secondary | ICD-10-CM

## 2019-06-21 DIAGNOSIS — E1165 Type 2 diabetes mellitus with hyperglycemia: Secondary | ICD-10-CM | POA: Diagnosis not present

## 2019-06-21 DIAGNOSIS — Z6832 Body mass index (BMI) 32.0-32.9, adult: Secondary | ICD-10-CM

## 2019-06-21 DIAGNOSIS — E6609 Other obesity due to excess calories: Secondary | ICD-10-CM

## 2019-06-21 LAB — POCT URINALYSIS DIPSTICK
Bilirubin, UA: NEGATIVE
Glucose, UA: NEGATIVE
Ketones, UA: NEGATIVE
Leukocytes, UA: NEGATIVE
Nitrite, UA: NEGATIVE
Protein, UA: NEGATIVE
Spec Grav, UA: 1.02 (ref 1.010–1.025)
Urobilinogen, UA: 0.2 E.U./dL
pH, UA: 7 (ref 5.0–8.0)

## 2019-06-21 LAB — POCT UA - MICROALBUMIN
Albumin/Creatinine Ratio, Urine, POC: 30
Creatinine, POC: 100 mg/dL
Microalbumin Ur, POC: 10 mg/L

## 2019-06-21 MED ORDER — DAYVIGO 5 MG PO TABS: 5.0000 mg | ORAL_TABLET | Freq: Every evening | ORAL | 0 refills | Status: DC | PRN

## 2019-06-21 MED ORDER — LISINOPRIL-HYDROCHLOROTHIAZIDE 20-12.5 MG PO TABS: 1.0000 | ORAL_TABLET | Freq: Every day | ORAL | 2 refills | Status: DC

## 2019-06-21 NOTE — Progress Notes (Signed)
Subjective:     Patient ID: Matthew Caldwell , male    DOB: 06-Dec-1945 , 73 y.o.   MRN: [id number]   Chief Complaint  Patient presents with  . Annual Exam  . Diabetes  . Hypertension    HPI  He is here today for a full physical examination. He is followed by Urology for his prostate exams. He has no specific concerns or complaints at this time.   Diabetes He presents for his follow-up diabetic visit. He has type 2 diabetes mellitus. There are no hypoglycemic associated symptoms. Pertinent negatives for hypoglycemia include no headaches. There are no diabetic associated symptoms. Pertinent negatives for diabetes include no blurred vision and no chest pain. There are no hypoglycemic complications. Risk factors for coronary artery disease include diabetes mellitus. Current diabetic treatment includes diet. He is compliant with treatment most of the time. He is following a diabetic diet. He participates in exercise daily. An ACE inhibitor/angiotensin II receptor blocker is being taken. Eye exam is current.  Hypertension This is a chronic problem. The current episode started more than 1 year ago. The problem has been gradually improving since onset. The problem is controlled. Pertinent negatives include no blurred vision, chest pain, headaches or shortness of breath. The current treatment provides moderate improvement.     Past Medical History:  Diagnosis Date  . Asthma   . Hypertension   . Prostate cancer [facility])      Family History  Problem Relation Age of Onset  . Diabetes Mother   . Asthma Father   . Cancer Neg Hx      Current Outpatient Medications:  .  albuterol (VENTOLIN HFA) 108 (90 Base) MCG/ACT inhaler, TAKE 2 PUFFS BY MOUTH EVERY 6 HOURS AS NEEDED FOR WHEEZE OR SHORTNESS OF BREATH, Disp: 6.7 Inhaler, Rfl: 1 .  ALPRAZolam (XANAX) 0.25 MG tablet, TAKE 1 TABLET (0.25 MG TOTAL) BY MOUTH DAILY AS NEEDED., Disp: 30 tablet, Rfl: 1 .  guaiFENesin (MUCINEX) 600 MG 12 hr tablet,  Take 1 tablet (600 mg total) by mouth 2 (two) times daily., Disp: 15 tablet, Rfl: 0 .  ipratropium (ATROVENT) 0.03 % nasal spray, PLACE 2 SPRAYS INTO BOTH NOSTRILS EVERY 12 (TWELVE) HOURS., Disp: 30 mL, Rfl: 2 .  lisinopril-hydrochlorothiazide (ZESTORETIC) 20-12.5 MG tablet, Take 1 tablet by mouth daily., Disp: 90 tablet, Rfl: 2 .  Multiple Vitamin (MULTIVITAMIN) tablet, Take 1 tablet by mouth daily., Disp: , Rfl:  .  zolpidem (AMBIEN) 10 MG tablet, TAKE 1/2 TO 1 TABLET BY MOUTH NIGHTLY AS NEEDED, Disp: 30 tablet, Rfl: 1 .  Lemborexant (DAYVIGO) 5 MG TABS, Take 5 mg by mouth at bedtime as needed., Disp: 10 tablet, Rfl: 0 .  tadalafil (CIALIS) 20 MG tablet, Take 20 mg by mouth daily as needed., Disp: , Rfl:    Allergies  Allergen Reactions  . Penicillins Rash    Men's preventive visit. Patient Health Questionnaire (PHQ-2) is    Office Visit from 06/21/2019 in [facility]  PHQ-2 Total Score  0    . Patient is on a diabetic diet. Marital status: Single. Relevant history for alcohol use is:  Social History   Substance and Sexual Activity  Alcohol Use Yes   Comment: 3 a week  . Relevant history for tobacco use is:  Social History   Tobacco Use  Smoking Status Former Smoker  . Packs/day: 0.25  . Years: 1.00  . Pack years: 0.25  . Types: Cigarettes  . Quit date: 11/25/1997  .  Years since quitting: 21.5  Smokeless Tobacco Never Used  .  Review of Systems  Constitutional: Negative.   HENT: Negative.   Eyes: Negative.  Negative for blurred vision.  Respiratory: Negative.  Negative for shortness of breath.   Cardiovascular: Negative.  Negative for chest pain.  Endocrine: Negative.   Genitourinary: Negative.   Musculoskeletal: Negative.   Skin: Negative.   Allergic/Immunologic: Negative.   Neurological: Negative.  Negative for headaches.  Hematological: Negative.   Psychiatric/Behavioral: Negative.      Today's Vitals   06/21/19 1010  Temp: 98.3 F (36.8  C)  TempSrc: Oral  Weight: 196 lb (88.9 kg)  Height: 5' 5.4" (1.661 m)  PainSc: 0-No pain   Body mass index is 32.22 kg/m.   Objective:  Physical Exam Vitals signs and nursing note reviewed.  Constitutional:      Appearance: Normal appearance.  HENT:     Head: Normocephalic and atraumatic.     Right Ear: Tympanic membrane, ear canal and external ear normal.     Left Ear: Tympanic membrane, ear canal and external ear normal.     Nose: Nose normal.     Mouth/Throat:     Mouth: Mucous membranes are moist.     Pharynx: Oropharynx is clear.  Eyes:     Extraocular Movements: Extraocular movements intact.     Conjunctiva/sclera: Conjunctivae normal.     Pupils: Pupils are equal, round, and reactive to light.  Neck:     Musculoskeletal: Normal range of motion and neck supple.  Cardiovascular:     Rate and Rhythm: Normal rate and regular rhythm.     Pulses: Normal pulses.          Dorsalis pedis pulses are 2+ on the right side and 2+ on the left side.     Heart sounds: Normal heart sounds.  Pulmonary:     Effort: Pulmonary effort is normal.     Breath sounds: Normal breath sounds.  Chest:     Breasts:        Right: Normal. No swelling, bleeding, inverted nipple, mass or nipple discharge.        Left: Normal. No swelling, bleeding, inverted nipple, mass or nipple discharge.  Abdominal:     General: Abdomen is flat. Bowel sounds are normal.     Palpations: Abdomen is soft.  Genitourinary:    Prostate: Normal.     Rectum: Normal. Guaiac result negative.  Musculoskeletal: Normal range of motion.  Feet:     Right foot:     Skin integrity: Skin integrity normal.     Left foot:     Skin integrity: Skin integrity normal.  Skin:    General: Skin is warm.  Neurological:     General: No focal deficit present.     Mental Status: He is alert.  Psychiatric:        Mood and Affect: Mood normal.        Behavior: Behavior normal.         Assessment And Plan:     1. Routine  general medical examination at health care facility  A full exam was performed.  PATIENT HAS BEEN ADVISED TO GET 30-45 MINUTES REGULAR EXERCISE NO LESS THAN FOUR TO FIVE DAYS PER WEEK - BOTH WEIGHTBEARING EXERCISES AND AEROBIC ARE RECOMMENDED.  HE IS ADVISED TO FOLLOW A HEALTHY DIET WITH AT LEAST SIX FRUITS/VEGGIES PER DAY, DECREASE INTAKE OF RED MEAT, AND TO INCREASE FISH INTAKE TO TWO DAYS PER WEEK.  MEATS/FISH  SHOULD NOT BE FRIED, BAKED OR BROILED IS PREFERABLE.  I SUGGEST WEARING SPF 50 SUNSCREEN ON EXPOSED PARTS AND ESPECIALLY WHEN IN THE DIRECT SUNLIGHT FOR AN EXTENDED PERIOD OF TIME.  PLEASE AVOID FAST FOOD RESTAURANTS AND INCREASE YOUR WATER INTAKE.   2. Type 2 diabetes mellitus with hyperglycemia, without long-term current use of insulin ([facility])  I DISCUSSED WITH THE PATIENT AT LENGTH REGARDING THE GOALS OF GLYCEMIC CONTROL AND POSSIBLE LONG-TERM COMPLICATIONS.  I  ALSO STRESSED THE IMPORTANCE OF COMPLIANCE WITH HOME GLUCOSE MONITORING, DIETARY RESTRICTIONS INCLUDING AVOIDANCE OF SUGARY DRINKS/PROCESSED FOODS,  ALONG WITH REGULAR EXERCISE.  I  ALSO STRESSED THE IMPORTANCE OF ANNUAL EYE EXAMS, SELF FOOT CARE AND COMPLIANCE WITH OFFICE VISITS.   3. Essential hypertension, benign  Well controlled. He will continue with current meds. He is encouraged to avoid adding salt to his foods and to stay well hydrated. EKG performed, possible AFB. I will continue to monitor this. He denies SOB and DOE, cp and palpitations.   - EKG 12-Lead  4. Primary insomnia  Chronic.  He was given rx Dayvigo, 5mg  to use nightly as needed. If effective, he will use this instead of zolpidem.   5. Class 1 obesity due to excess calories with serious comorbidity and body mass index (BMI) of 32.0 to 32.9 in adult  Importance of achieving optimal weight to decrease risk of cardiovascular disease and cancers was discussed with the patient in full detail. He is encouraged to start slowly - start with 10 minutes twice daily  at least three to four days per week and to gradually build to 30 minutes five days weekly. He was given tips to incorporate more activity into her daily routine - take stairs when possible, park farther away from his job, grocery stores, etc.      Maximino Greenland, MD    THE PATIENT IS ENCOURAGED TO PRACTICE SOCIAL DISTANCING DUE TO THE COVID-19 PANDEMIC.

## 2019-06-21 NOTE — Patient Instructions (Signed)
Health Maintenance, Male Adopting a healthy lifestyle and getting preventive care are important in promoting health and wellness. Ask your health care provider about:  The right schedule for you to have regular tests and exams.  Things you can do on your own to prevent diseases and keep yourself healthy. What should I know about diet, weight, and exercise? Eat a healthy diet   Eat a diet that includes plenty of vegetables, fruits, low-fat dairy products, and lean protein.  Do not eat a lot of foods that are high in solid fats, added sugars, or sodium. Maintain a healthy weight Body mass index (BMI) is a measurement that can be used to identify possible weight problems. It estimates body fat based on height and weight. Your health care provider can help determine your BMI and help you achieve or maintain a healthy weight. Get regular exercise Get regular exercise. This is one of the most important things you can do for your health. Most adults should:  Exercise for at least 150 minutes each week. The exercise should increase your heart rate and make you sweat (moderate-intensity exercise).  Do strengthening exercises at least twice a week. This is in addition to the moderate-intensity exercise.  Spend less time sitting. Even light physical activity can be beneficial. Watch cholesterol and blood lipids Have your blood tested for lipids and cholesterol at 74 years of age, then have this test every 5 years. You may need to have your cholesterol levels checked more often if:  Your lipid or cholesterol levels are high.  You are older than 74 years of age.  You are at high risk for heart disease. What should I know about cancer screening? Many types of cancers can be detected early and may often be prevented. Depending on your health history and family history, you may need to have cancer screening at various ages. This may include screening for:  Colorectal cancer.  Prostate cancer.   Skin cancer.  Lung cancer. What should I know about heart disease, diabetes, and high blood pressure? Blood pressure and heart disease  High blood pressure causes heart disease and increases the risk of stroke. This is more likely to develop in people who have high blood pressure readings, are of African descent, or are overweight.  Talk with your health care provider about your target blood pressure readings.  Have your blood pressure checked: ? Every 3-5 years if you are 74-12 years of age. ? Every year if you are 26 years old or older.  If you are between the ages of 57 and 39 and are a current or former smoker, ask your health care provider if you should have a one-time screening for abdominal aortic aneurysm (AAA). Diabetes Have regular diabetes screenings. This checks your fasting blood sugar level. Have the screening done:  Once every three years after age 15 if you are at a normal weight and have a low risk for diabetes.  More often and at a younger age if you are overweight or have a high risk for diabetes. What should I know about preventing infection? Hepatitis B If you have a higher risk for hepatitis B, you should be screened for this virus. Talk with your health care provider to find out if you are at risk for hepatitis B infection. Hepatitis C Blood testing is recommended for:  Everyone born from [birth year] through 1966.  Anyone with known risk factors for hepatitis C. Sexually transmitted infections (STIs)  You should be screened each year  for STIs, including gonorrhea and chlamydia, if: ? You are sexually active and are younger than 74 years of age. ? You are older than 74 years of age and your health care provider tells you that you are at risk for this type of infection. ? Your sexual activity has changed since you were last screened, and you are at increased risk for chlamydia or gonorrhea. Ask your health care provider if you are at risk.  Ask your health care  provider about whether you are at high risk for HIV. Your health care provider may recommend a prescription medicine to help prevent HIV infection. If you choose to take medicine to prevent HIV, you should first get tested for HIV. You should then be tested every 3 months for as long as you are taking the medicine. Follow these instructions at home: Lifestyle  Do not use any products that contain nicotine or tobacco, such as cigarettes, e-cigarettes, and chewing tobacco. If you need help quitting, ask your health care provider.  Do not use street drugs.  Do not share needles.  Ask your health care provider for help if you need support or information about quitting drugs. Alcohol use  Do not drink alcohol if your health care provider tells you not to drink.  If you drink alcohol: ? Limit how much you have to 0-2 drinks a day. ? Be aware of how much alcohol is in your drink. In the [place] one drink equals one 12 oz bottle of beer (355 mL), one 5 oz glass of wine (148 mL), or one 1 oz glass of hard liquor (44 mL). General instructions  Schedule regular health, dental, and eye exams.  Stay current with your vaccines.  Tell your health care provider if: ? You often feel depressed. ? You have ever been abused or do not feel safe at home. Summary  Adopting a healthy lifestyle and getting preventive care are important in promoting health and wellness.  Follow your health care provider's instructions about healthy diet, exercising, and getting tested or screened for diseases.  Follow your health care provider's instructions on monitoring your cholesterol and blood pressure. This information is not intended to replace advice given to you by your health care provider. Make sure you discuss any questions you have with your health care provider. Document Released: 05/09/2008 Document Revised: 11/04/2018 Document Reviewed: 11/04/2018 Elsevier Patient Education  [employer].   Plantar  Fasciitis  Plantar fasciitis is a painful foot condition that affects the heel. It occurs when the band of tissue that connects the toes to the heel bone (plantar fascia) becomes irritated. This can happen as the result of exercising too much or doing other repetitive activities (overuse injury). The pain from plantar fasciitis can range from mild irritation to severe pain that makes it difficult to walk or move. The pain is usually worse in the morning after sleeping, or after sitting or lying down for a while. Pain may also be worse after long periods of walking or standing. What are the causes? This condition may be caused by:  Standing for long periods of time.  Wearing shoes that do not have good arch support.  Doing activities that put stress on joints (high-impact activities), including running, aerobics, and ballet.  Being overweight.  An abnormal way of walking (gait).  Tight muscles in the back of your lower leg (calf).  High arches in your feet.  Starting a new athletic activity. What are the signs or symptoms? The  main symptom of this condition is heel pain. Pain may:  Be worse with first steps after a time of rest, especially in the morning after sleeping or after you have been sitting or lying down for a while.  Be worse after long periods of standing still.  Decrease after 30-45 minutes of activity, such as gentle walking. How is this diagnosed? This condition may be diagnosed based on your medical history and your symptoms. Your health care provider may ask questions about your activity level. Your health care provider will do a physical exam to check for:  A tender area on the bottom of your foot.  A high arch in your foot.  Pain when you move your foot.  Difficulty moving your foot. You may have imaging tests to confirm the diagnosis, such as:  X-rays.  Ultrasound.  MRI. How is this treated? Treatment for plantar fasciitis depends on how severe your  condition is. Treatment may include:  Rest, ice, applying pressure (compression), and raising the affected foot (elevation). This may be called RICE therapy. Your health care provider may recommend RICE therapy along with over-the-counter pain medicines to manage your pain.  Exercises to stretch your calves and your plantar fascia.  A splint that holds your foot in a stretched, upward position while you sleep (night splint).  Physical therapy to relieve symptoms and prevent problems in the future.  Injections of steroid medicine (cortisone) to relieve pain and inflammation.  Stimulating your plantar fascia with electrical impulses (extracorporeal shock wave therapy). This is usually the last treatment option before surgery.  Surgery, if other treatments have not worked after 12 months. Follow these instructions at home:  Managing pain, stiffness, and swelling  If directed, put ice on the painful area: ? Put ice in a plastic bag, or use a frozen bottle of water. ? Place a towel between your skin and the bag or bottle. ? Roll the bottom of your foot over the bag or bottle. ? Do this for 20 minutes, 2-3 times a day.  Wear athletic shoes that have air-sole or gel-sole cushions, or try wearing soft shoe inserts that are designed for plantar fasciitis.  Raise (elevate) your foot above the level of your heart while you are sitting or lying down. Activity  Avoid activities that cause pain. Ask your health care provider what activities are safe for you.  Do physical therapy exercises and stretches as told by your health care provider.  Try activities and forms of exercise that are easier on your joints (low-impact). Examples include swimming, water aerobics, and biking. General instructions  Take over-the-counter and prescription medicines only as told by your health care provider.  Wear a night splint while sleeping, if told by your health care provider. Loosen the splint if your toes  tingle, become numb, or turn cold and blue.  Maintain a healthy weight, or work with your health care provider to lose weight as needed.  Keep all follow-up visits as told by your health care provider. This is important. Contact a health care provider if you:  Have symptoms that do not go away after caring for yourself at home.  Have pain that gets worse.  Have pain that affects your ability to move or do your daily activities. Summary  Plantar fasciitis is a painful foot condition that affects the heel. It occurs when the band of tissue that connects the toes to the heel bone (plantar fascia) becomes irritated.  The main symptom of this condition is  heel pain that may be worse after exercising too much or standing still for a long time.  Treatment varies, but it usually starts with rest, ice, compression, and elevation (RICE therapy) and over-the-counter medicines to manage pain. This information is not intended to replace advice given to you by your health care provider. Make sure you discuss any questions you have with your health care provider. Document Released: 08/06/2001 Document Revised: 10/24/2017 Document Reviewed: 09/08/2017 Elsevier Patient Education  2020 [employer].

## 2019-06-22 LAB — LIPID PANEL
Chol/HDL Ratio: 2 ratio (ref 0.0–5.0)
Cholesterol, Total: 168 mg/dL (ref 100–199)
HDL: 84 mg/dL (ref >39)
LDL Calculated: 73 mg/dL (ref 0–99)
Triglycerides: 57 mg/dL (ref 0–149)
VLDL Cholesterol Cal: 11 mg/dL (ref 5–40)

## 2019-06-22 LAB — CBC
Hematocrit: 38.6 % (ref 37.5–51.0)
Hemoglobin: 13.4 g/dL (ref 13.0–17.7)
MCH: 33.8 pg — ABNORMAL HIGH (ref 26.6–33.0)
MCHC: 34.7 g/dL (ref 31.5–35.7)
MCV: 97 fL (ref 79–97)
Platelets: 234 10*3/uL (ref 150–450)
RBC: 3.97 x10E6/uL — ABNORMAL LOW (ref 4.14–5.80)
RDW: 14 % (ref 11.6–15.4)
WBC: 7 10*3/uL (ref 3.4–10.8)

## 2019-06-22 LAB — CMP14+EGFR
ALT: 18 IU/L (ref 0–44)
AST: 25 IU/L (ref 0–40)
Albumin/Globulin Ratio: 1.8 (ref 1.2–2.2)
Albumin: 4.7 g/dL (ref 3.7–4.7)
Alkaline Phosphatase: 45 IU/L (ref 39–117)
BUN/Creatinine Ratio: 12 (ref 10–24)
BUN: 13 mg/dL (ref 8–27)
Bilirubin Total: 0.5 mg/dL (ref 0.0–1.2)
CO2: 26 mmol/L (ref 20–29)
Calcium: 9.8 mg/dL (ref 8.6–10.2)
Chloride: 99 mmol/L (ref 96–106)
Creatinine, Ser: 1.1 mg/dL (ref 0.76–1.27)
GFR calc Af Amer: 76 mL/min/{1.73_m2} (ref >59)
GFR calc non Af Amer: 66 mL/min/{1.73_m2} (ref >59)
Globulin, Total: 2.6 g/dL (ref 1.5–4.5)
Glucose: 110 mg/dL — ABNORMAL HIGH (ref 65–99)
Potassium: 4.5 mmol/L (ref 3.5–5.2)
Sodium: 139 mmol/L (ref 134–144)
Total Protein: 7.3 g/dL (ref 6.0–8.5)

## 2019-06-22 LAB — TSH: TSH: 1.08 u[IU]/mL (ref 0.450–4.500)

## 2019-06-22 LAB — HEMOGLOBIN A1C
Est. average glucose Bld gHb Est-mCnc: 120 mg/dL
Hgb A1c MFr Bld: 5.8 % — ABNORMAL HIGH (ref 4.8–5.6)

## 2019-06-23 LAB — HM COLONOSCOPY

## 2019-06-24 ENCOUNTER — Other Ambulatory Visit: Payer: Self-pay | Admitting: Internal Medicine

## 2019-06-24 ENCOUNTER — Telehealth: Payer: Self-pay

## 2019-06-24 NOTE — Telephone Encounter (Signed)
Left the patient a message to call back for lab results. 

## 2019-06-24 NOTE — Telephone Encounter (Signed)
-----   Message from Glendale Chard, MD sent at 06/22/2019 10:16 PM EDT ----- Your liver and kidney function are normal. Your blood count is normal.  Cholesterol looks great.a1c is better, 5.8. Normal is less than 5.6. thyroid fxn is nl.

## 2019-06-25 ENCOUNTER — Telehealth: Payer: Self-pay

## 2019-06-25 NOTE — Telephone Encounter (Signed)
-----   Message from Glendale Chard, MD sent at 06/22/2019 10:16 PM EDT ----- Your liver and kidney function are normal. Your blood count is normal.  Cholesterol looks great.a1c is better, 5.8. Normal is less than 5.6. thyroid fxn is nl.

## 2019-06-25 NOTE — Telephone Encounter (Signed)
Left the patient a message to call back for lab results. 

## 2019-06-29 ENCOUNTER — Encounter: Payer: Self-pay | Admitting: Internal Medicine

## 2019-07-06 ENCOUNTER — Encounter: Payer: Self-pay | Admitting: Internal Medicine

## 2019-08-23 ENCOUNTER — Encounter: Payer: Self-pay | Admitting: Internal Medicine

## 2019-08-25 ENCOUNTER — Other Ambulatory Visit: Payer: Self-pay | Admitting: Internal Medicine

## 2019-08-26 NOTE — Telephone Encounter (Signed)
Would like refill Zolpidem (Ambien)10 MG

## 2019-09-15 ENCOUNTER — Telehealth: Payer: Self-pay

## 2019-09-15 NOTE — Telephone Encounter (Signed)
I called patient to see if he has completed his cologuard and he stated he had a colonscopy done instead with Dr.Mann and that he has requested that they fax [place] the results. YRL,RMA

## 2019-09-28 ENCOUNTER — Telehealth: Payer: Self-pay

## 2019-09-28 NOTE — Telephone Encounter (Signed)
The pt was called about completing his cologuard kit and the pt said that he did a colonoscopy in July.

## 2019-10-25 ENCOUNTER — Encounter: Payer: Self-pay | Admitting: Internal Medicine

## 2019-10-25 ENCOUNTER — Other Ambulatory Visit: Payer: Self-pay

## 2019-10-25 ENCOUNTER — Ambulatory Visit: Payer: 59 | Admitting: Internal Medicine

## 2019-10-25 VITALS — BP 124/70 | HR 67 | Temp 98.2°F | Ht 65.0 in | Wt 188.0 lb

## 2019-10-25 DIAGNOSIS — M79671 Pain in right foot: Secondary | ICD-10-CM | POA: Diagnosis not present

## 2019-10-25 DIAGNOSIS — M79672 Pain in left foot: Secondary | ICD-10-CM | POA: Diagnosis not present

## 2019-10-25 DIAGNOSIS — E1165 Type 2 diabetes mellitus with hyperglycemia: Secondary | ICD-10-CM | POA: Diagnosis not present

## 2019-10-25 DIAGNOSIS — Z6831 Body mass index (BMI) 31.0-31.9, adult: Secondary | ICD-10-CM

## 2019-10-25 DIAGNOSIS — I10 Essential (primary) hypertension: Secondary | ICD-10-CM

## 2019-10-25 DIAGNOSIS — E6609 Other obesity due to excess calories: Secondary | ICD-10-CM

## 2019-10-25 DIAGNOSIS — Z23 Encounter for immunization: Secondary | ICD-10-CM

## 2019-10-25 DIAGNOSIS — B351 Tinea unguium: Secondary | ICD-10-CM

## 2019-10-25 DIAGNOSIS — C61 Malignant neoplasm of prostate: Secondary | ICD-10-CM

## 2019-10-25 MED ORDER — PNEUMOCOCCAL 13-VAL CONJ VACC IM SUSP: 0.5000 mL | INTRAMUSCULAR | 0 refills | Status: AC

## 2019-10-25 MED ORDER — CICLOPIROX 8 % EX SOLN: Freq: Every day | CUTANEOUS | 2 refills | Status: DC

## 2019-10-25 NOTE — Progress Notes (Signed)
Subjective:     Patient ID: Matthew Caldwell , male    DOB: 10-Mar-1946 , 73 y.o.   MRN: [id number]   Chief Complaint  Patient presents with  . Diabetes  . Foot Pain    HPI  Diabetes He presents for his follow-up diabetic visit. He has type 2 diabetes mellitus. There are no hypoglycemic associated symptoms. Pertinent negatives for hypoglycemia include no headaches. There are no diabetic associated symptoms. Pertinent negatives for diabetes include no blurred vision and no chest pain. There are no hypoglycemic complications. Risk factors for coronary artery disease include diabetes mellitus. Current diabetic treatment includes diet. He is compliant with treatment most of the time. He is following a diabetic diet. He participates in exercise daily. An ACE inhibitor/angiotensin II receptor blocker is being taken. He does not see a podiatrist.Eye exam is current.  Foot Pain This is a new problem. The current episode started more than 1 month ago. The problem occurs daily. The problem has been unchanged. Associated symptoms include arthralgias. Pertinent negatives include no chest pain or headaches. The symptoms are aggravated by walking. He has tried nothing for the symptoms.  Hypertension This is a chronic problem. The current episode started more than 1 year ago. The problem has been gradually improving since onset. The problem is controlled. Pertinent negatives include no blurred vision, chest pain, headaches or shortness of breath. The current treatment provides moderate improvement.     Past Medical History:  Diagnosis Date  . Asthma   . Hypertension   . Prostate cancer [place])      Family History  Problem Relation Age of Onset  . Diabetes Mother   . Asthma Father   . Cancer Neg Hx      Current Outpatient Medications:  .  albuterol (VENTOLIN HFA) 108 (90 Base) MCG/ACT inhaler, TAKE 2 PUFFS BY MOUTH EVERY 6 HOURS AS NEEDED FOR WHEEZE OR SHORTNESS OF BREATH, Disp: 6.7 Inhaler, Rfl:  1 .  ipratropium (ATROVENT) 0.03 % nasal spray, PLACE 2 SPRAYS INTO BOTH NOSTRILS EVERY 12 (TWELVE) HOURS., Disp: 30 mL, Rfl: 2 .  Lemborexant (DAYVIGO) 5 MG TABS, Take 5 mg by mouth at bedtime as needed., Disp: 10 tablet, Rfl: 0 .  lisinopril-hydrochlorothiazide (ZESTORETIC) 20-12.5 MG tablet, Take 1 tablet by mouth daily., Disp: 90 tablet, Rfl: 2 .  Multiple Vitamin (MULTIVITAMIN) tablet, Take 1 tablet by mouth daily., Disp: , Rfl:  .  zolpidem (AMBIEN) 10 MG tablet, TAKE 1/2 TO 1 TABLET BY MOUTH NIGHTLY AS NEEDED, Disp: 30 tablet, Rfl: 1 .  ALPRAZolam (XANAX) 0.25 MG tablet, TAKE 1 TABLET (0.25 MG TOTAL) BY MOUTH DAILY AS NEEDED. (Patient not taking: Reported on 10/25/2019), Disp: 30 tablet, Rfl: 1 .  ciclopirox (PENLAC) 8 % solution, Apply topically at bedtime. Apply over nail and surrounding skin. Apply daily over previous coat. After seven (7) days, may remove with alcohol and continue cycle., Disp: 6.6 mL, Rfl: 2 .  guaiFENesin (MUCINEX) 600 MG 12 hr tablet, Take 1 tablet (600 mg total) by mouth 2 (two) times daily. (Patient not taking: Reported on 10/25/2019), Disp: 15 tablet, Rfl: 0 .  pneumococcal 13-valent conjugate vaccine (PREVNAR 13) SUSP injection, Inject 0.5 mLs into the muscle tomorrow at 10 am for 1 dose., Disp: 0.5 mL, Rfl: 0 .  tadalafil (CIALIS) 20 MG tablet, Take 20 mg by mouth daily as needed., Disp: , Rfl:    Allergies  Allergen Reactions  . Penicillins Rash     Review of Systems  Constitutional:  Negative.   Eyes: Negative for blurred vision.  Respiratory: Negative.  Negative for shortness of breath.   Cardiovascular: Negative.  Negative for chest pain.  Gastrointestinal: Negative.   Musculoskeletal: Positive for arthralgias.       He c/o b/l heel pain. There is pain as he steps out of bed in am. Sx started about six weeks ago. He reports walking 1-3 miles per day. He thinks his increased walking has exacerbated his sx. He denies fall/trauma. Thinks he could have bone  spurs.   Neurological: Negative.  Negative for headaches.  Psychiatric/Behavioral: Negative.      Today's Vitals   10/25/19 1202  BP: 124/70  Pulse: 67  Temp: 98.2 F (36.8 C)  TempSrc: Oral  Weight: 188 lb (85.3 kg)  Height: 5' 5" (1.651 m)  PainSc: 7   PainLoc: Foot   Body mass index is 31.28 kg/m.   Objective:  Physical Exam Vitals signs and nursing note reviewed.  Constitutional:      Appearance: Normal appearance.  Cardiovascular:     Rate and Rhythm: Normal rate and regular rhythm.     Heart sounds: Normal heart sounds.  Pulmonary:     Effort: Pulmonary effort is normal.     Breath sounds: Normal breath sounds.  Feet:     Comments: There is tenderness of heels. Thickened toenails.  Skin:    General: Skin is warm.  Neurological:     General: No focal deficit present.     Mental Status: He is alert.  Psychiatric:        Mood and Affect: Mood normal.         Assessment And Plan:     1. Type 2 diabetes mellitus with hyperglycemia, without long-term current use of insulin ([place])  I will check labs as listed below. He was commended on his new exercise program.   - BMP8+EGFR - Hemoglobin A1c  2. Heel pain, bilateral  I will refer him to Podiatry for further evaluation/radiographic studies. Ddx includes bone spurs vs. Plantar fasciitis. He was also given stretching exercises to perform daily.   - Ambulatory referral to Podiatry  3. Essential hypertension, benign  Chronic, well controlled. He will continue with current meds.   4. Malignant neoplasm of prostate ([facility])  Chronic. As per Urology.   5. Immunization due  I will send rx Prevnar-13 to the pharmacy.   6. Class 1 obesity due to excess calories with serious comorbidity and body mass index (BMI) of 31.0 to 31.9 in adult  He was congratulated on his eight pound weight loss since July 2020. He is encouraged to continue with his exercise program as tolerated.      Maximino Greenland, MD    THE  PATIENT IS ENCOURAGED TO PRACTICE SOCIAL DISTANCING DUE TO THE COVID-19 PANDEMIC.

## 2019-10-25 NOTE — Patient Instructions (Signed)
Heel Spur  A heel spur is a bony growth that forms on the bottom of the heel bone (calcaneus). Heel spurs are common. They often cause inflammation in the band of tissue that connects the toes to the heel bone (plantar fascia). This may cause pain on the bottom of the foot, near the heel. Many people with plantar fasciitis also have heel spurs. However, spurs are not the cause of plantar fasciitis pain. What are the causes? The exact cause of heel spurs is not known. They may be caused by:  Pressure on the heel bone.  Bands of tissue (tendons) pulling on the heel bone. What increases the risk? You are more likely to develop this condition if you:  Are older than 40.  Are overweight.  Have wear-and-tear arthritis (osteoarthritis).  Have plantar fascia inflammation.  Participate in sports or activities that include a lot of running or jumping.  Wear poorly fitted shoes. What are the signs or symptoms? Some people have no symptoms. If you do have symptoms, they may include:  Pain in the bottom of your heel.  Pain that is worse when you first get out of bed.  Pain that gets worse after walking or standing. How is this diagnosed? This condition may be diagnosed based on:  Your symptoms and medical history.  A physical exam.  A foot X-ray. How is this treated? Treatment for this condition depends on how much pain you have. Treatment options may include:  Doing stretching exercises.  Losing weight, if necessary.  Wearing specific shoes or inserts inside of shoes (orthotics) for comfort and support.  Wearing splints on your feet while you sleep. Splints keep your feet in a position (usually 90 degrees) that should prevent and relieve the pain you feel when you first get out of bed. They also make stretching easier in the morning.  Taking over-the-counter medicine to relieve pain, such as NSAIDs.  Using high-intensity sound waves to break up the heel spur (extracorporeal  shock wave therapy).  Getting steroid injections in your heel to reduce inflammation.  Having surgery, if your heel spur causes long-term (chronic) pain. Follow these instructions at home:  Activity  Avoid activities that cause pain until you recover, or for as long as directed by your health care provider.  Do stretching exercises as directed. Stretch before exercising or being physically active. Managing pain, stiffness, and swelling  If directed, put ice on your foot: ? Put ice in a plastic bag. ? Place a towel between your skin and the bag. ? Leave the ice on for 20 minutes, 2-3 times a day.  Move your toes often to avoid stiffness and to lessen swelling.  When possible, raise (elevate) your foot above the level of your heart while you are sitting or lying down. General instructions  Take over-the-counter and prescription medicines only as told by your health care provider.  Wear supportive shoes that fit well. Wear splints, inserts, or orthotics as told by your health care provider.  If recommended, work with your health care provider to lose weight. This can relieve pressure on your foot.  Do not use any products that contain nicotine or tobacco, such as cigarettes and e-cigarettes. These can affect bone growth and healing. If you need help quitting, ask your health care provider.  Keep all follow-up visits as told by your health care provider. This is important. Contact a health care provider if:  Your pain does not go away with treatment.  Your pain gets  worse. Summary  A heel spur is a bony growth that forms on the bottom of the heel bone (calcaneus).  Heel spurs often cause inflammation in the band of tissue that connects the toes to the heel bone (plantar fascia). This may cause pain on the bottom of the foot, near the heel.  Doing stretching exercises, losing weight, wearing specific shoes or shoe inserts, wearing splints while you sleep, and taking pain  medicine may ease the pain and stiffness.  Other treatment options may include high-intensity sound waves to break up the heel spur, steroid injections, or surgery. This information is not intended to replace advice given to you by your health care provider. Make sure you discuss any questions you have with your health care provider. Document Released: 12/18/2005 Document Revised: 10/29/2017 Document Reviewed: 10/29/2017 Elsevier Patient Education  2020 [employer].

## 2019-10-26 LAB — BMP8+EGFR
BUN/Creatinine Ratio: 12 (ref 10–24)
BUN: 14 mg/dL (ref 8–27)
CO2: 23 mmol/L (ref 20–29)
Calcium: 10.3 mg/dL — ABNORMAL HIGH (ref 8.6–10.2)
Chloride: 100 mmol/L (ref 96–106)
Creatinine, Ser: 1.14 mg/dL (ref 0.76–1.27)
GFR calc Af Amer: 73 mL/min/{1.73_m2} (ref >59)
GFR calc non Af Amer: 63 mL/min/{1.73_m2} (ref >59)
Glucose: 103 mg/dL — ABNORMAL HIGH (ref 65–99)
Potassium: 4.6 mmol/L (ref 3.5–5.2)
Sodium: 140 mmol/L (ref 134–144)

## 2019-10-26 LAB — HEMOGLOBIN A1C
Est. average glucose Bld gHb Est-mCnc: 108 mg/dL
Hgb A1c MFr Bld: 5.4 % (ref 4.8–5.6)

## 2019-11-01 ENCOUNTER — Encounter: Payer: Self-pay | Admitting: Internal Medicine

## 2019-11-05 ENCOUNTER — Other Ambulatory Visit: Payer: Self-pay | Admitting: Internal Medicine

## 2019-11-08 NOTE — Telephone Encounter (Signed)
Please refill patient's prescription 

## 2019-11-09 ENCOUNTER — Ambulatory Visit: Payer: 59 | Admitting: Sports Medicine

## 2019-11-09 ENCOUNTER — Encounter: Payer: Self-pay | Admitting: Sports Medicine

## 2019-11-09 ENCOUNTER — Ambulatory Visit (INDEPENDENT_AMBULATORY_CARE_PROVIDER_SITE_OTHER): Payer: 59

## 2019-11-09 ENCOUNTER — Other Ambulatory Visit: Payer: Self-pay

## 2019-11-09 VITALS — BP 165/84 | HR 64 | Temp 97.0°F

## 2019-11-09 DIAGNOSIS — M79671 Pain in right foot: Secondary | ICD-10-CM | POA: Diagnosis not present

## 2019-11-09 DIAGNOSIS — M79672 Pain in left foot: Secondary | ICD-10-CM

## 2019-11-09 DIAGNOSIS — B351 Tinea unguium: Secondary | ICD-10-CM | POA: Diagnosis not present

## 2019-11-09 DIAGNOSIS — M722 Plantar fascial fibromatosis: Secondary | ICD-10-CM | POA: Diagnosis not present

## 2019-11-09 MED ORDER — MELOXICAM 15 MG PO TABS: 15.0000 mg | ORAL_TABLET | Freq: Every day | ORAL | 0 refills | Status: DC

## 2019-11-09 NOTE — Progress Notes (Signed)
Subjective: Devell Brinkerhoff is a 73 y.o. male patient presents to office with complaint of moderate heel pain on the left x 2 weeks that is worse with exercise and  Reports that the pain is better with rest. Admits that he is concerned for fungus. Has not tried any treatment. Denies any other pedal complaints.   Review of Systems  All other systems reviewed and are negative.    Patient Active Problem List   Diagnosis Date Noted  . Uncontrolled type 2 diabetes mellitus with hyperglycemia ([place]) 11/23/2018  . Essential hypertension, benign 11/23/2018  . Primary insomnia 11/23/2018  . Class 1 obesity due to excess calories with serious comorbidity and body mass index (BMI) of 31.0 to 31.9 in adult 11/23/2018  . Malignant neoplasm of prostate ([place]) 09/24/2017    Current Outpatient Medications on File Prior to Visit  Medication Sig Dispense Refill  . albuterol (VENTOLIN HFA) 108 (90 Base) MCG/ACT inhaler TAKE 2 PUFFS BY MOUTH EVERY 6 HOURS AS NEEDED FOR WHEEZE OR SHORTNESS OF BREATH 6.7 Inhaler 1  . ALPRAZolam (XANAX) 0.25 MG tablet TAKE 1 TABLET (0.25 MG TOTAL) BY MOUTH DAILY AS NEEDED. 30 tablet 1  . ciclopirox (PENLAC) 8 % solution Apply topically at bedtime. Apply over nail and surrounding skin. Apply daily over previous coat. After seven (7) days, may remove with alcohol and continue cycle. 6.6 mL 2  . guaiFENesin (MUCINEX) 600 MG 12 hr tablet Take 1 tablet (600 mg total) by mouth 2 (two) times daily. 15 tablet 0  . ipratropium (ATROVENT) 0.03 % nasal spray PLACE 2 SPRAYS INTO BOTH NOSTRILS EVERY 12 (TWELVE) HOURS. 30 mL 2  . Lemborexant (DAYVIGO) 5 MG TABS Take 5 mg by mouth at bedtime as needed. 10 tablet 0  . lisinopril-hydrochlorothiazide (ZESTORETIC) 20-12.5 MG tablet Take 1 tablet by mouth daily. 90 tablet 2  . Multiple Vitamin (MULTIVITAMIN) tablet Take 1 tablet by mouth daily.    . tadalafil (CIALIS) 20 MG tablet Take 20 mg by mouth daily as needed.    . zolpidem (AMBIEN) 10 MG  tablet TAKE 1/2 TO 1 TABLET BY MOUTH NIGHTLY AS NEEDED 30 tablet 1   No current facility-administered medications on file prior to visit.    Allergies  Allergen Reactions  . Penicillins Rash    Objective: Physical Exam General: The patient is alert and oriented x3 in no acute distress.  Dermatology: Skin is warm, dry and supple bilateral lower extremities. Bilateral discoloration of hallux nails with some subungal debris. There is no erythema, edema, no eccymosis, no open lesions present. Integument is otherwise unremarkable.  Vascular: Dorsalis Pedis pulse and Posterior Tibial pulse are 2/4 bilateral. Capillary fill time is immediate to all digits.  Neurological: Grossly intact to light touch with an achilles reflex of +2/5 and a negative Tinel's sign bilateral.  Musculoskeletal: Tenderness to palpation at the medial calcaneal tubercale and through the insertion of the plantar fascia on the left foot. No pain with compression of calcaneus bilateral. No pain with tuning fork to calcaneus bilateral. No pain with calf compression bilateral. There is decreased Ankle joint range of motion bilateral. All other joints range of motion within normal limits bilateral. Strength 5/5 in all groups bilateral.   Gait: Unassisted, Antalgic avoid weight on left heel  Xray, Right/Left foot:  Normal osseous mineralization. Joint spaces preserved. No fracture/dislocation/boney destruction. Calcaneal spur present with mild thickening of plantar fascia. No other soft tissue abnormalities or radiopaque foreign bodies.   Assessment and Plan: Problem List  Items Addressed This Visit    None    Visit Diagnoses    Plantar fasciitis    -  Primary   Relevant Medications   meloxicam (MOBIC) 15 MG tablet   Other Relevant Orders   DG Foot Complete Right   DG Foot Complete Left   Foot pain, bilateral       Nail fungus         -Complete examination performed.  -Xrays reviewed -Discussed with patient in  detail the condition of plantar fasciitis/left heel pain, how this occurs and general treatment options. Explained both conservative and surgical treatments.  -Patient declined steroid injection at this time so I sent oral medication instead.  -Rx Meloxicam. -Added heel lift bilateral  -Explained and dispensed to patient daily stretching exercises. -Recommend patient to ice affected area 1-2x daily. -Patient to return to office in 4 weeks for follow up and fungal culture (nails were too short to sample at this visit) or sooner if problems or questions arise.  Landis Martins, DPM

## 2019-11-09 NOTE — Patient Instructions (Signed)

## 2019-12-01 ENCOUNTER — Other Ambulatory Visit: Payer: Self-pay | Admitting: Sports Medicine

## 2019-12-01 DIAGNOSIS — M722 Plantar fascial fibromatosis: Secondary | ICD-10-CM

## 2019-12-01 NOTE — Telephone Encounter (Signed)
DR. Cannon Kettle please advice

## 2019-12-07 ENCOUNTER — Encounter: Payer: Self-pay | Admitting: Sports Medicine

## 2019-12-07 ENCOUNTER — Other Ambulatory Visit: Payer: Self-pay

## 2019-12-07 ENCOUNTER — Ambulatory Visit: Payer: 59 | Admitting: Sports Medicine

## 2019-12-07 DIAGNOSIS — M722 Plantar fascial fibromatosis: Secondary | ICD-10-CM

## 2019-12-07 DIAGNOSIS — B351 Tinea unguium: Secondary | ICD-10-CM | POA: Diagnosis not present

## 2019-12-07 DIAGNOSIS — M79676 Pain in unspecified toe(s): Secondary | ICD-10-CM

## 2019-12-07 DIAGNOSIS — E1165 Type 2 diabetes mellitus with hyperglycemia: Secondary | ICD-10-CM

## 2019-12-07 DIAGNOSIS — M79671 Pain in right foot: Secondary | ICD-10-CM

## 2019-12-07 DIAGNOSIS — M79672 Pain in left foot: Secondary | ICD-10-CM

## 2019-12-07 LAB — HM DIABETES EYE EXAM

## 2019-12-07 NOTE — Progress Notes (Signed)
Subjective: Matthew Caldwell is a 74 y.o. male patient seen today in office with complaint of mildly painful thickened and discolored nails. Patient is desiring treatment for nail changes; has tried OTC topicals/Medication in the past with no improvement. Reports that nails are becoming difficult to manage because of the thickness.  Patient has let his toenails grow out and is here for fungal culture.  Patient is also here for follow-up evaluation of left heel pain reports that it feels better and feels like the meloxicam medication is helping and wants to discuss if he should need a shot. Patient has no other pedal complaints at this time.   Patient Active Problem List   Diagnosis Date Noted  . Uncontrolled type 2 diabetes mellitus with hyperglycemia ([place]) 11/23/2018  . Essential hypertension, benign 11/23/2018  . Primary insomnia 11/23/2018  . Class 1 obesity due to excess calories with serious comorbidity and body mass index (BMI) of 31.0 to 31.9 in adult 11/23/2018  . Malignant neoplasm of prostate ([place]) 09/24/2017    Current Outpatient Medications on File Prior to Visit  Medication Sig Dispense Refill  . albuterol (VENTOLIN HFA) 108 (90 Base) MCG/ACT inhaler TAKE 2 PUFFS BY MOUTH EVERY 6 HOURS AS NEEDED FOR WHEEZE OR SHORTNESS OF BREATH 6.7 Inhaler 1  . ALPRAZolam (XANAX) 0.25 MG tablet TAKE 1 TABLET (0.25 MG TOTAL) BY MOUTH DAILY AS NEEDED. 30 tablet 1  . ciclopirox (PENLAC) 8 % solution Apply topically at bedtime. Apply over nail and surrounding skin. Apply daily over previous coat. After seven (7) days, may remove with alcohol and continue cycle. 6.6 mL 2  . guaiFENesin (MUCINEX) 600 MG 12 hr tablet Take 1 tablet (600 mg total) by mouth 2 (two) times daily. 15 tablet 0  . ipratropium (ATROVENT) 0.03 % nasal spray PLACE 2 SPRAYS INTO BOTH NOSTRILS EVERY 12 (TWELVE) HOURS. 30 mL 2  . Lemborexant (DAYVIGO) 5 MG TABS Take 5 mg by mouth at bedtime as needed. 10 tablet 0  .  lisinopril-hydrochlorothiazide (ZESTORETIC) 20-12.5 MG tablet Take 1 tablet by mouth daily. 90 tablet 2  . meloxicam (MOBIC) 15 MG tablet TAKE 1 TABLET BY MOUTH EVERY DAY 30 tablet 0  . Multiple Vitamin (MULTIVITAMIN) tablet Take 1 tablet by mouth daily.    . tadalafil (CIALIS) 20 MG tablet Take 20 mg by mouth daily as needed.    . zolpidem (AMBIEN) 10 MG tablet TAKE 1/2 TO 1 TABLET BY MOUTH NIGHTLY AS NEEDED 30 tablet 1   No current facility-administered medications on file prior to visit.    Allergies  Allergen Reactions  . Penicillins Rash    Objective: Physical Exam  General: Well developed, nourished, no acute distress, awake, alert and oriented x 3  Vascular: Dorsalis pedis artery 2/4 bilateral, Posterior tibial artery 1/4 bilateral, skin temperature warm to warm proximal to distal bilateral lower extremities, no varicosities, pedal hair present bilateral.  Neurological: Gross sensation present via light touch bilateral.   Dermatological: Skin is warm, dry, and supple bilateral, Nails 1-10 are tender, short thick, and discolored with mild subungal debris, no webspace macerations present bilateral, no open lesions present bilateral, no callus/corns/hyperkeratotic tissue present bilateral. No signs of infection bilateral.  Musculoskeletal: Decreased pain to palpation at plantar fascial insertional left heel.  Pes planus boney deformities noted bilateral. Muscular strength within normal limits without painon range of motion. No pain with calf compression bilateral.  Assessment and Plan:  Problem List Items Addressed This Visit      Endocrine  Uncontrolled type 2 diabetes mellitus with hyperglycemia ([place])    Other Visit Diagnoses    Nail fungus    -  Primary   Relevant Orders   Culture, fungus without smear   Plantar fasciitis       Foot pain, bilateral          -Examined patient -Discussed treatment options for her left heel pain -Advised patient to continue with  gentle stretching icing good supportive shoes and heel lifts daily -Advised patient since areas are doing better continue with meloxicam until completed -No injection needed at this time since left heel pain is doing considerably better -Discussed treatment options for painful dystrophic nails  -Fungal culture was obtained by removing a portion of the hard nail itself from each of the involved toenails using a sterile nail nipper and sent to [facility] lab. Patient tolerated the biopsy procedure well without discomfort or need for anesthesia.  -Patient to return in 4 weeks for follow up evaluation and discussion of fungal culture results or sooner if symptoms worsen.  Landis Martins, DPM

## 2019-12-09 ENCOUNTER — Encounter: Payer: Self-pay | Admitting: Internal Medicine

## 2020-01-04 ENCOUNTER — Other Ambulatory Visit: Payer: Self-pay

## 2020-01-04 ENCOUNTER — Encounter: Payer: Self-pay | Admitting: Sports Medicine

## 2020-01-04 ENCOUNTER — Ambulatory Visit: Payer: 59 | Admitting: Sports Medicine

## 2020-01-04 VITALS — Temp 96.6°F

## 2020-01-04 DIAGNOSIS — M79671 Pain in right foot: Secondary | ICD-10-CM

## 2020-01-04 DIAGNOSIS — M79672 Pain in left foot: Secondary | ICD-10-CM

## 2020-01-04 DIAGNOSIS — E1165 Type 2 diabetes mellitus with hyperglycemia: Secondary | ICD-10-CM

## 2020-01-04 DIAGNOSIS — B351 Tinea unguium: Secondary | ICD-10-CM | POA: Diagnosis not present

## 2020-01-04 DIAGNOSIS — M722 Plantar fascial fibromatosis: Secondary | ICD-10-CM

## 2020-01-04 NOTE — Progress Notes (Signed)
Subjective: Matthew Caldwell is a 74 y.o. male patient seen today in office for fungal culture results. Patient reports that his heel pain is much better very minimal and has improved greatly when he started to use ice.  Patient has no other pedal complaints at this time.   Patient Active Problem List   Diagnosis Date Noted  . Uncontrolled type 2 diabetes mellitus with hyperglycemia ([place]) 11/23/2018  . Essential hypertension, benign 11/23/2018  . Primary insomnia 11/23/2018  . Class 1 obesity due to excess calories with serious comorbidity and body mass index (BMI) of 31.0 to 31.9 in adult 11/23/2018  . Malignant neoplasm of prostate ([place]) 09/24/2017    Current Outpatient Medications on File Prior to Visit  Medication Sig Dispense Refill  . albuterol (VENTOLIN HFA) 108 (90 Base) MCG/ACT inhaler TAKE 2 PUFFS BY MOUTH EVERY 6 HOURS AS NEEDED FOR WHEEZE OR SHORTNESS OF BREATH 6.7 Inhaler 1  . ALPRAZolam (XANAX) 0.25 MG tablet TAKE 1 TABLET (0.25 MG TOTAL) BY MOUTH DAILY AS NEEDED. 30 tablet 1  . ciclopirox (PENLAC) 8 % solution Apply topically at bedtime. Apply over nail and surrounding skin. Apply daily over previous coat. After seven (7) days, may remove with alcohol and continue cycle. 6.6 mL 2  . guaiFENesin (MUCINEX) 600 MG 12 hr tablet Take 1 tablet (600 mg total) by mouth 2 (two) times daily. 15 tablet 0  . ipratropium (ATROVENT) 0.03 % nasal spray PLACE 2 SPRAYS INTO BOTH NOSTRILS EVERY 12 (TWELVE) HOURS. 30 mL 2  . Lemborexant (DAYVIGO) 5 MG TABS Take 5 mg by mouth at bedtime as needed. 10 tablet 0  . lisinopril-hydrochlorothiazide (ZESTORETIC) 20-12.5 MG tablet Take 1 tablet by mouth daily. 90 tablet 2  . meloxicam (MOBIC) 15 MG tablet TAKE 1 TABLET BY MOUTH EVERY DAY 30 tablet 0  . Multiple Vitamin (MULTIVITAMIN) tablet Take 1 tablet by mouth daily.    . tadalafil (CIALIS) 20 MG tablet Take 20 mg by mouth daily as needed.    . zolpidem (AMBIEN) 10 MG tablet TAKE 1/2 TO 1 TABLET BY  MOUTH NIGHTLY AS NEEDED 30 tablet 1   No current facility-administered medications on file prior to visit.    Allergies  Allergen Reactions  . Penicillins Rash    Objective: Physical Exam General: Well developed, nourished, no acute distress, awake, alert and oriented x 3  Vascular: Dorsalis pedis artery 2/4 bilateral, Posterior tibial artery 1/4 bilateral, skin temperature warm to warm proximal to distal bilateral lower extremities, no varicosities, pedal hair present bilateral.  Neurological: Gross sensation present via light touch bilateral.   Dermatological: Skin is warm, dry, and supple bilateral, Nails 1-10 are tender, short thick, and discolored with mild subungal debris, no webspace macerations present bilateral, no open lesions present bilateral, no callus/corns/hyperkeratotic tissue present bilateral. No signs of infection bilateral.  Musculoskeletal:  No pain to palpation at plantar fascial insertion on left heel.  Pes planus boney deformities noted bilateral. Muscular strength within normal limits without painon range of motion. No pain with calf compression bilateral.  Fungal culture + Trichophyton rubrum  Assessment and Plan:  Problem List Items Addressed This Visit      Endocrine   Uncontrolled type 2 diabetes mellitus with hyperglycemia ([place])    Other Visit Diagnoses    Nail fungus    -  Primary   Plantar fasciitis       Foot pain, bilateral         -Examined patient -Discussed long-term care for plantar  fasciitis -Advised patient to continue with gentle stretching and icing and good supportive shoes if pain recurs may benefit from a steroid shot -Discussed treatment options for painful mycotic nails -Patient opt for topical; prescription faxed to [place] good hygiene habits -Patient to return office as needed or sooner if symptoms worsen.  Landis Martins, DPM

## 2020-02-01 ENCOUNTER — Other Ambulatory Visit: Payer: Self-pay | Admitting: Internal Medicine

## 2020-02-02 NOTE — Telephone Encounter (Signed)
Zolpidem refill

## 2020-02-22 ENCOUNTER — Ambulatory Visit: Payer: 59 | Admitting: Internal Medicine

## 2020-02-22 ENCOUNTER — Encounter: Payer: Self-pay | Admitting: Internal Medicine

## 2020-02-22 ENCOUNTER — Other Ambulatory Visit: Payer: Self-pay

## 2020-02-22 VITALS — BP 152/68 | HR 67 | Temp 98.3°F | Ht 65.0 in | Wt 201.4 lb

## 2020-02-22 DIAGNOSIS — I10 Essential (primary) hypertension: Secondary | ICD-10-CM | POA: Diagnosis not present

## 2020-02-22 DIAGNOSIS — E1165 Type 2 diabetes mellitus with hyperglycemia: Secondary | ICD-10-CM

## 2020-02-22 DIAGNOSIS — Z6833 Body mass index (BMI) 33.0-33.9, adult: Secondary | ICD-10-CM

## 2020-02-22 DIAGNOSIS — E6609 Other obesity due to excess calories: Secondary | ICD-10-CM | POA: Diagnosis not present

## 2020-02-22 DIAGNOSIS — F5101 Primary insomnia: Secondary | ICD-10-CM

## 2020-02-22 NOTE — Progress Notes (Signed)
This visit occurred during the SARS-CoV-2 public health emergency.  Safety protocols were in place, including screening questions prior to the visit, additional usage of staff PPE, and extensive cleaning of exam room while observing appropriate contact time as indicated for disinfecting solutions.  Subjective:     Patient ID: Matthew Caldwell , male    DOB: 1946-11-01 , 74 y.o.   MRN: [id number]   Chief Complaint  Patient presents with  . Diabetes  . Hypertension  . Insomnia    HPI  He is here today for a DM/HTN check. He admits he is not taking BP meds every day. He is hoping to control with lifestyle modifications. He denies cp, sob, palpitations and visual disturbances.  Diabetes He presents for his follow-up diabetic visit. He has type 2 diabetes mellitus. There are no hypoglycemic associated symptoms. Pertinent negatives for hypoglycemia include no headaches. There are no diabetic associated symptoms. Pertinent negatives for diabetes include no blurred vision and no chest pain. There are no hypoglycemic complications. Risk factors for coronary artery disease include diabetes mellitus. Current diabetic treatment includes diet. He is compliant with treatment most of the time. He is following a diabetic diet. He participates in exercise daily. An ACE inhibitor/angiotensin II receptor blocker is being taken. Eye exam is current.  Hypertension This is a chronic problem. The current episode started more than 1 year ago. The problem has been gradually improving since onset. The problem is controlled. Pertinent negatives include no blurred vision, chest pain, headaches or shortness of breath. Risk factors for coronary artery disease include diabetes mellitus, dyslipidemia, obesity, sedentary lifestyle and male gender. The current treatment provides moderate improvement. Compliance problems include exercise.   Insomnia Primary symptoms: sleep disturbance, difficulty falling asleep.  The problem  occurs nightly. The symptoms are relieved by medication. Past treatments include medication. Typical bedtime:  11-12 P.M..  PMH includes: hypertension.     Past Medical History:  Diagnosis Date  . Asthma   . Hypertension   . Prostate cancer [facility])      Family History  Problem Relation Age of Onset  . Diabetes Mother   . Asthma Father   . Cancer Neg Hx      Current Outpatient Medications:  .  albuterol (VENTOLIN HFA) 108 (90 Base) MCG/ACT inhaler, TAKE 2 PUFFS BY MOUTH EVERY 6 HOURS AS NEEDED FOR WHEEZE OR SHORTNESS OF BREATH, Disp: 6.7 Inhaler, Rfl: 1 .  ALPRAZolam (XANAX) 0.25 MG tablet, TAKE 1 TABLET (0.25 MG TOTAL) BY MOUTH DAILY AS NEEDED., Disp: 30 tablet, Rfl: 1 .  ciclopirox (PENLAC) 8 % solution, Apply topically at bedtime. Apply over nail and surrounding skin. Apply daily over previous coat. After seven (7) days, may remove with alcohol and continue cycle., Disp: 6.6 mL, Rfl: 2 .  ipratropium (ATROVENT) 0.03 % nasal spray, PLACE 2 SPRAYS INTO BOTH NOSTRILS EVERY 12 (TWELVE) HOURS., Disp: 30 mL, Rfl: 2 .  lisinopril-hydrochlorothiazide (ZESTORETIC) 20-12.5 MG tablet, Take 1 tablet by mouth daily., Disp: 90 tablet, Rfl: 2 .  Multiple Vitamin (MULTIVITAMIN) tablet, Take 1 tablet by mouth daily., Disp: , Rfl:  .  NON FORMULARY,  Apothecary  Antifungal -- Terbinafine 3%; Fluconazole 2%; Tea Tree Oil 5%; Urea 10%; Ibuprofen 2% in DMSO Suspension #68m  Apply to affected nail(s) once (at bedtime) or twice daily  Can have up to 5 refills  [facility], Disp: , Rfl:  .  zolpidem (AMBIEN) 10 MG tablet, TAKE 1/2 TO 1 TABLET BY MOUTH  NIGHTLY AS NEEDED, Disp: 30 tablet, Rfl: 1 .  guaiFENesin (MUCINEX) 600 MG 12 hr tablet, Take 1 tablet (600 mg total) by mouth 2 (two) times daily. (Patient not taking: Reported on 02/22/2020), Disp: 15 tablet, Rfl: 0 .  meloxicam (MOBIC) 15 MG tablet, TAKE 1 TABLET BY MOUTH EVERY DAY (Patient not taking: Reported on 02/22/2020),  Disp: 30 tablet, Rfl: 0 .  tadalafil (CIALIS) 20 MG tablet, Take 20 mg by mouth daily as needed., Disp: , Rfl:    Allergies  Allergen Reactions  . Penicillins Rash     Review of Systems  Constitutional: Negative.   Eyes: Negative for blurred vision.  Respiratory: Negative.  Negative for shortness of breath.   Cardiovascular: Negative.  Negative for chest pain.  Gastrointestinal: Negative.   Neurological: Negative.  Negative for headaches.  Psychiatric/Behavioral: Positive for sleep disturbance. The patient has insomnia.      Today's Vitals   02/22/20 1159  BP: (!) 152/68  Pulse: 67  Temp: 98.3 F (36.8 C)  TempSrc: Oral  Weight: 201 lb 6.4 oz (91.4 kg)  Height: 5' 5"  (1.651 m)  PainSc: 0-No pain   Body mass index is 33.51 kg/m.   Objective:  Physical Exam Vitals and nursing note reviewed.  Constitutional:      Appearance: Normal appearance. He is obese.  Cardiovascular:     Rate and Rhythm: Normal rate and regular rhythm.     Heart sounds: Normal heart sounds.  Pulmonary:     Effort: Pulmonary effort is normal.     Breath sounds: Normal breath sounds.  Skin:    General: Skin is warm.  Neurological:     General: No focal deficit present.     Mental Status: He is alert.  Psychiatric:        Mood and Affect: Mood normal.         Assessment And Plan:     1. Type 2 diabetes mellitus with hyperglycemia, without long-term current use of insulin (HCC)  Chronic, I will check labs as listed below. He is encouraged to avoid sugary beverages and increase his exercise.   - CMP14+EGFR - Hemoglobin A1c  2. Essential hypertension, benign  Chronic, uncontrolled due to noncompliance with meds. He is encouraged to avoid adding salt to his foods. He is also encouraged to take meds daily. Pt advised we can consider weaning him off once he loses ten percent of his body weight.   3. Primary insomnia  Chronic. We discussed the importance of having a bedtime routine. He  will be given refill of zolpidem as needed.    5. Class 1 obesity due to excess calories with serious comorbidity and body mass index (BMI) of 33.0 to 33.9 in adult   He was encouraged to strive for BMI less than 30 to decrease cardiac risk. Unfortunately, his exercise routine has been stalled due to flare of plantar fasciitis. He plans to start cycling in the near future.   Maximino Greenland, MD    THE PATIENT IS ENCOURAGED TO PRACTICE SOCIAL DISTANCING DUE TO THE COVID-19 PANDEMIC.

## 2020-02-22 NOTE — Patient Instructions (Signed)

## 2020-02-23 LAB — HEMOGLOBIN A1C
Est. average glucose Bld gHb Est-mCnc: 120 mg/dL
Hgb A1c MFr Bld: 5.8 % — ABNORMAL HIGH (ref 4.8–5.6)

## 2020-02-23 LAB — CMP14+EGFR
ALT: 16 IU/L (ref 0–44)
AST: 24 IU/L (ref 0–40)
Albumin/Globulin Ratio: 1.8 (ref 1.2–2.2)
Albumin: 4.8 g/dL — ABNORMAL HIGH (ref 3.7–4.7)
Alkaline Phosphatase: 41 IU/L (ref 39–117)
BUN/Creatinine Ratio: 12 (ref 10–24)
BUN: 13 mg/dL (ref 8–27)
Bilirubin Total: 0.4 mg/dL (ref 0.0–1.2)
CO2: 21 mmol/L (ref 20–29)
Calcium: 9.8 mg/dL (ref 8.6–10.2)
Chloride: 102 mmol/L (ref 96–106)
Creatinine, Ser: 1.09 mg/dL (ref 0.76–1.27)
GFR calc Af Amer: 76 mL/min/{1.73_m2} (ref >59)
GFR calc non Af Amer: 66 mL/min/{1.73_m2} (ref >59)
Globulin, Total: 2.7 g/dL (ref 1.5–4.5)
Glucose: 102 mg/dL — ABNORMAL HIGH (ref 65–99)
Potassium: 4.8 mmol/L (ref 3.5–5.2)
Sodium: 142 mmol/L (ref 134–144)
Total Protein: 7.5 g/dL (ref 6.0–8.5)

## 2020-04-21 ENCOUNTER — Other Ambulatory Visit: Payer: Self-pay | Admitting: Internal Medicine

## 2020-04-21 NOTE — Telephone Encounter (Signed)
Last 02/22/20 Next 06/26/20

## 2020-06-26 ENCOUNTER — Encounter: Payer: Self-pay | Admitting: Internal Medicine

## 2020-06-26 ENCOUNTER — Encounter: Payer: 59 | Admitting: Internal Medicine

## 2020-06-26 ENCOUNTER — Other Ambulatory Visit: Payer: Self-pay

## 2020-06-26 ENCOUNTER — Ambulatory Visit (INDEPENDENT_AMBULATORY_CARE_PROVIDER_SITE_OTHER): Payer: Medicare HMO | Admitting: Internal Medicine

## 2020-06-26 VITALS — BP 134/72 | HR 74 | Temp 97.9°F | Ht 65.0 in | Wt 193.6 lb

## 2020-06-26 DIAGNOSIS — G8929 Other chronic pain: Secondary | ICD-10-CM

## 2020-06-26 DIAGNOSIS — Z6832 Body mass index (BMI) 32.0-32.9, adult: Secondary | ICD-10-CM

## 2020-06-26 DIAGNOSIS — M791 Myalgia, unspecified site: Secondary | ICD-10-CM

## 2020-06-26 DIAGNOSIS — R0982 Postnasal drip: Secondary | ICD-10-CM | POA: Diagnosis not present

## 2020-06-26 DIAGNOSIS — H2513 Age-related nuclear cataract, bilateral: Secondary | ICD-10-CM | POA: Diagnosis not present

## 2020-06-26 DIAGNOSIS — H43812 Vitreous degeneration, left eye: Secondary | ICD-10-CM | POA: Diagnosis not present

## 2020-06-26 DIAGNOSIS — E119 Type 2 diabetes mellitus without complications: Secondary | ICD-10-CM | POA: Diagnosis not present

## 2020-06-26 DIAGNOSIS — E6609 Other obesity due to excess calories: Secondary | ICD-10-CM | POA: Diagnosis not present

## 2020-06-26 DIAGNOSIS — E1165 Type 2 diabetes mellitus with hyperglycemia: Secondary | ICD-10-CM | POA: Diagnosis not present

## 2020-06-26 DIAGNOSIS — E7800 Pure hypercholesterolemia, unspecified: Secondary | ICD-10-CM

## 2020-06-26 DIAGNOSIS — H17821 Peripheral opacity of cornea, right eye: Secondary | ICD-10-CM | POA: Diagnosis not present

## 2020-06-26 DIAGNOSIS — I10 Essential (primary) hypertension: Secondary | ICD-10-CM

## 2020-06-26 DIAGNOSIS — M25512 Pain in left shoulder: Secondary | ICD-10-CM | POA: Diagnosis not present

## 2020-06-26 DIAGNOSIS — Z23 Encounter for immunization: Secondary | ICD-10-CM

## 2020-06-26 DIAGNOSIS — H401131 Primary open-angle glaucoma, bilateral, mild stage: Secondary | ICD-10-CM | POA: Diagnosis not present

## 2020-06-26 MED ORDER — SHINGRIX 50 MCG/0.5ML IM SUSR: 0.5000 mL | Freq: Once | INTRAMUSCULAR | 0 refills | Status: AC

## 2020-06-26 NOTE — Patient Instructions (Addendum)
Voltaren gel - apply to left shoulder two to three times daily as needed.   Diabetes Mellitus and Exercise Exercising regularly is important for your overall health, especially when you have diabetes (diabetes mellitus). Exercising is not only about losing weight. It has many other health benefits, such as increasing muscle strength and bone density and reducing body fat and stress. This leads to improved fitness, flexibility, and endurance, all of which result in better overall health. Exercise has additional benefits for people with diabetes, including:  Reducing appetite.  Helping to lower and control blood glucose.  Lowering blood pressure.  Helping to control amounts of fatty substances (lipids) in the blood, such as cholesterol and triglycerides.  Helping the body to respond better to insulin (improving insulin sensitivity).  Reducing how much insulin the body needs.  Decreasing the risk for heart disease by: ? Lowering cholesterol and triglyceride levels. ? Increasing the levels of good cholesterol. ? Lowering blood glucose levels. What is my activity plan? Your health care provider or certified diabetes educator can help you make a plan for the type and frequency of exercise (activity plan) that works for you. Make sure that you:  Do at least 150 minutes of moderate-intensity or vigorous-intensity exercise each week. This could be brisk walking, biking, or water aerobics. ? Do stretching and strength exercises, such as yoga or weightlifting, at least 2 times a week. ? Spread out your activity over at least 3 days of the week.  Get some form of physical activity every day. ? Do not go more than 2 days in a row without some kind of physical activity. ? Avoid being inactive for more than 30 minutes at a time. Take frequent breaks to walk or stretch.  Choose a type of exercise or activity that you enjoy, and set realistic goals.  Start slowly, and gradually increase the  intensity of your exercise over time. What do I need to know about managing my diabetes?   Check your blood glucose before and after exercising. ? If your blood glucose is 240 mg/dL (13.3 mmol/L) or higher before you exercise, check your urine for ketones. If you have ketones in your urine, do not exercise until your blood glucose returns to normal. ? If your blood glucose is 100 mg/dL (5.6 mmol/L) or lower, eat a snack containing 15-20 grams of carbohydrate. Check your blood glucose 15 minutes after the snack to make sure that your level is above 100 mg/dL (5.6 mmol/L) before you start your exercise.  Know the symptoms of low blood glucose (hypoglycemia) and how to treat it. Your risk for hypoglycemia increases during and after exercise. Common symptoms of hypoglycemia can include: ? Hunger. ? Anxiety. ? Sweating and feeling clammy. ? Confusion. ? Dizziness or feeling light-headed. ? Increased heart rate or palpitations. ? Blurry vision. ? Tingling or numbness around the mouth, lips, or tongue. ? Tremors or shakes. ? Irritability.  Keep a rapid-acting carbohydrate snack available before, during, and after exercise to help prevent or treat hypoglycemia.  Avoid injecting insulin into areas of the body that are going to be exercised. For example, avoid injecting insulin into: ? The arms, when playing tennis. ? The legs, when jogging.  Keep records of your exercise habits. Doing this can help you and your health care provider adjust your diabetes management plan as needed. Write down: ? Food that you eat before and after you exercise. ? Blood glucose levels before and after you exercise. ? The type and amount  of exercise you have done. ? When your insulin is expected to peak, if you use insulin. Avoid exercising at times when your insulin is peaking.  When you start a new exercise or activity, work with your health care provider to make sure the activity is safe for you, and to adjust  your insulin, medicines, or food intake as needed.  Drink plenty of water while you exercise to prevent dehydration or heat stroke. Drink enough fluid to keep your urine clear or pale yellow. Summary  Exercising regularly is important for your overall health, especially when you have diabetes (diabetes mellitus).  Exercising has many health benefits, such as increasing muscle strength and bone density and reducing body fat and stress.  Your health care provider or certified diabetes educator can help you make a plan for the type and frequency of exercise (activity plan) that works for you.  When you start a new exercise or activity, work with your health care provider to make sure the activity is safe for you, and to adjust your insulin, medicines, or food intake as needed. This information is not intended to replace advice given to you by your health care provider. Make sure you discuss any questions you have with your health care provider. Document Revised: 06/05/2017 Document Reviewed: 04/22/2016 Elsevier Patient Education  [place].

## 2020-06-26 NOTE — Progress Notes (Signed)
I,Tianna Badgett,acting as a Education administrator for Maximino Greenland, MD.,have documented all relevant documentation on the behalf of Maximino Greenland, MD,as directed by  Maximino Greenland, MD while in the presence of Maximino Greenland, MD.  This visit occurred during the SARS-CoV-2 public health emergency.  Safety protocols were in place, including screening questions prior to the visit, additional usage of staff PPE, and extensive cleaning of exam room while observing appropriate contact time as indicated for disinfecting solutions.  Subjective:     Patient ID: Matthew Caldwell , male    DOB: 1946-06-20 , 74 y.o.   MRN: [id number]   Chief Complaint  Patient presents with  . Diabetes  . Hypertension    HPI  Diabetes He presents for his follow-up diabetic visit. He has type 2 diabetes mellitus. There are no hypoglycemic associated symptoms. Pertinent negatives for hypoglycemia include no headaches. There are no diabetic associated symptoms. Pertinent negatives for diabetes include no blurred vision and no chest pain. There are no hypoglycemic complications. Risk factors for coronary artery disease include diabetes mellitus. Current diabetic treatment includes diet. He is compliant with treatment most of the time. He is following a diabetic diet. He participates in exercise daily. An ACE inhibitor/angiotensin II receptor blocker is being taken. Eye exam is current.  Hypertension This is a chronic problem. The current episode started more than 1 year ago. The problem has been gradually improving since onset. The problem is controlled. Pertinent negatives include no blurred vision, chest pain, headaches or shortness of breath. The current treatment provides moderate improvement.     Past Medical History:  Diagnosis Date  . Asthma   . Hypertension   . Prostate cancer [facility])      Family History  Problem Relation Age of Onset  . Diabetes Mother   . Asthma Father   . Cancer Neg Hx      Current Outpatient  Medications:  .  albuterol (VENTOLIN HFA) 108 (90 Base) MCG/ACT inhaler, TAKE 2 PUFFS BY MOUTH EVERY 6 HOURS AS NEEDED FOR WHEEZE OR SHORTNESS OF BREATH, Disp: 6.7 Inhaler, Rfl: 1 .  ALPRAZolam (XANAX) 0.25 MG tablet, TAKE 1 TABLET (0.25 MG TOTAL) BY MOUTH DAILY AS NEEDED., Disp: 15 tablet, Rfl: 0 .  ciclopirox (PENLAC) 8 % solution, Apply topically at bedtime. Apply over nail and surrounding skin. Apply daily over previous coat. After seven (7) days, may remove with alcohol and continue cycle., Disp: 6.6 mL, Rfl: 2 .  guaiFENesin (MUCINEX) 600 MG 12 hr tablet, Take 1 tablet (600 mg total) by mouth 2 (two) times daily., Disp: 15 tablet, Rfl: 0 .  ipratropium (ATROVENT) 0.03 % nasal spray, PLACE 2 SPRAYS INTO BOTH NOSTRILS EVERY 12 (TWELVE) HOURS., Disp: 30 mL, Rfl: 2 .  lisinopril-hydrochlorothiazide (ZESTORETIC) 20-12.5 MG tablet, Take 1 tablet by mouth daily., Disp: 90 tablet, Rfl: 2 .  Multiple Vitamin (MULTIVITAMIN) tablet, Take 1 tablet by mouth daily., Disp: , Rfl:  .  NON FORMULARY, [facility]  Antifungal -- Terbinafine 3%; Fluconazole 2%; Tea Tree Oil 5%; Urea 10%; Ibuprofen 2% in DMSO Suspension #51m  Apply to affected nail(s) once (at bedtime) or twice daily  Can have up to 5 refills  [facility], Disp: , Rfl:  .  tadalafil (CIALIS) 20 MG tablet, Take 20 mg by mouth daily as needed., Disp: , Rfl:  .  zolpidem (AMBIEN) 10 MG tablet, TAKE 1/2 TO 1 TABLET BY MOUTH NIGHTLY AS NEEDED, Disp: 30 tablet, Rfl: 1 .  Zoster Vaccine Adjuvanted [facility]) injection, Inject 0.5 mLs into the muscle once for 1 dose., Disp: 0.5 mL, Rfl: 0   Allergies  Allergen Reactions  . Penicillins Rash     Review of Systems  Constitutional: Negative.   HENT: Positive for postnasal drip.   Eyes: Negative for blurred vision.  Respiratory: Negative.  Negative for shortness of breath.   Cardiovascular: Negative.  Negative for chest pain.  Gastrointestinal: Negative.    Musculoskeletal: Positive for arthralgias and myalgias.       He c/o left shoulder pain. He denies fall/trauma. States his sx occurred after using resistance bands.   Neurological: Negative.  Negative for headaches.     Today's Vitals   06/26/20 1051  BP: (!) 134/72  Pulse: 74  Temp: 97.9 F (36.6 C)  TempSrc: Oral  Weight: 193 lb 9.6 oz (87.8 kg)  Height: 5' 5"  (1.651 m)  PainSc: 8    Body mass index is 32.22 kg/m.   Objective:  Physical Exam Vitals and nursing note reviewed.  Constitutional:      Appearance: Normal appearance. He is obese.  Cardiovascular:     Rate and Rhythm: Normal rate and regular rhythm.     Heart sounds: Normal heart sounds.  Pulmonary:     Effort: Pulmonary effort is normal.     Breath sounds: Normal breath sounds.  Skin:    General: Skin is warm.  Neurological:     General: No focal deficit present.     Mental Status: He is alert.  Psychiatric:        Mood and Affect: Mood normal.         Assessment And Plan:     1. Type 2 diabetes mellitus with hyperglycemia, without long-term current use of insulin (HCC) Comments: Chronic. this has been well controlled. Will check labs as listed below. - Hemoglobin A1c - Lipid panel - CMP14+EGFR  2. Essential hypertension, benign Comments: Fair control. He will continue with current meds. Encouraged to avoid adding salt to his foods.   3. Pure hypercholesterolemia Comments: Chronic. He is aware that LDL goal is less than 70. He is willing to consider taking statin at least once weekly. I will check lipid panel and make further recommendations once his results are available for review.   4. Chronic left shoulder pain Comments: He is adivised to apply topical Voltaren gel to affected area bid-tid. He is encouraged to let me know if his sx persist. If so, I will refer him to Ortho for further evaluation.   5. Myalgia Comments: He is encouraged to stay well hydrated.  He may also benefit from  magnesium supplementation nightly.   6. Postnasal drip Comments: He is advised to take Zyrtec, 66m nightly as needed.   7. Class 1 obesity due to excess calories with serious comorbidity and body mass index (BMI) of 32.0 to 32.9 in adult  He is encouraged to initially strive for BMI less than 30 to decrease cardiac risk. He is advised to exercise no less than 150 minutes per week.   8. Immunization due Comments: Rx Shingrix was sent to his local pharmacy.    He is encouraged to strive for BMI less than 30 to decrease cardiac risk. Advised to aim for at least 150 minutes of exercise per week. Wt Readings from Last 3 Encounters:  06/26/20 193 lb 9.6 oz (87.8 kg)  02/22/20 201 lb 6.4 oz (91.4 kg)  10/25/19 188 lb (85.3 kg)     Patient  was given opportunity to ask questions. Patient verbalized understanding of the plan and was able to repeat key elements of the plan. All questions were answered to their satisfaction.  Maximino Greenland, MD   I, Maximino Greenland, MD, have reviewed all documentation for this visit. The documentation on 06/26/20 for the exam, diagnosis, procedures, and orders are all accurate and complete.  THE PATIENT IS ENCOURAGED TO PRACTICE SOCIAL DISTANCING DUE TO THE COVID-19 PANDEMIC.

## 2020-06-27 LAB — CMP14+EGFR
ALT: 18 IU/L (ref 0–44)
AST: 23 IU/L (ref 0–40)
Albumin/Globulin Ratio: 1.6 (ref 1.2–2.2)
Albumin: 4.4 g/dL (ref 3.7–4.7)
Alkaline Phosphatase: 41 IU/L — ABNORMAL LOW (ref 48–121)
BUN/Creatinine Ratio: 13 (ref 10–24)
BUN: 15 mg/dL (ref 8–27)
Bilirubin Total: 0.4 mg/dL (ref 0.0–1.2)
CO2: 26 mmol/L (ref 20–29)
Calcium: 10.1 mg/dL (ref 8.6–10.2)
Chloride: 100 mmol/L (ref 96–106)
Creatinine, Ser: 1.18 mg/dL (ref 0.76–1.27)
GFR calc Af Amer: 69 mL/min/{1.73_m2} (ref >59)
GFR calc non Af Amer: 60 mL/min/{1.73_m2} (ref >59)
Globulin, Total: 2.8 g/dL (ref 1.5–4.5)
Glucose: 108 mg/dL — ABNORMAL HIGH (ref 65–99)
Potassium: 4.4 mmol/L (ref 3.5–5.2)
Sodium: 140 mmol/L (ref 134–144)
Total Protein: 7.2 g/dL (ref 6.0–8.5)

## 2020-06-27 LAB — LIPID PANEL
Chol/HDL Ratio: 2.5 ratio (ref 0.0–5.0)
Cholesterol, Total: 202 mg/dL — ABNORMAL HIGH (ref 100–199)
HDL: 82 mg/dL (ref >39)
LDL Chol Calc (NIH): 104 mg/dL — ABNORMAL HIGH (ref 0–99)
Triglycerides: 88 mg/dL (ref 0–149)
VLDL Cholesterol Cal: 16 mg/dL (ref 5–40)

## 2020-06-27 LAB — HEMOGLOBIN A1C
Est. average glucose Bld gHb Est-mCnc: 126 mg/dL
Hgb A1c MFr Bld: 6 % — ABNORMAL HIGH (ref 4.8–5.6)

## 2020-07-03 ENCOUNTER — Telehealth: Payer: Self-pay

## 2020-07-03 NOTE — Telephone Encounter (Signed)
-----   Message from Glendale Chard, MD sent at 07/01/2020  2:09 PM EDT ----- Your hba1c is 6.0, this is stable. Your LDL, bad cholesterol is 104. Goal for those with diabetes is less than 70.  Are you willing to take rx chol meds? If not, are you willing to take a supplement? Your liver and kidney function are stable.

## 2020-07-04 ENCOUNTER — Other Ambulatory Visit: Payer: Self-pay

## 2020-07-04 ENCOUNTER — Telehealth: Payer: Self-pay

## 2020-07-04 MED ORDER — ATORVASTATIN CALCIUM 10 MG PO TABS: 10.0000 mg | ORAL_TABLET | Freq: Every day | ORAL | 1 refills | Status: DC

## 2020-07-04 NOTE — Telephone Encounter (Signed)
-----   Message from Glendale Chard, MD sent at 07/01/2020  2:09 PM EDT ----- Your hba1c is 6.0, this is stable. Your LDL, bad cholesterol is 104. Goal for those with diabetes is less than 70.  Are you willing to take rx chol meds? If not, are you willing to take a supplement? Your liver and kidney function are stable.

## 2020-07-04 NOTE — Telephone Encounter (Signed)
m for pt to call back for lab results

## 2020-07-06 ENCOUNTER — Other Ambulatory Visit: Payer: Self-pay | Admitting: Internal Medicine

## 2020-07-10 NOTE — Telephone Encounter (Signed)
Zolpidem refill

## 2020-07-14 ENCOUNTER — Telehealth: Payer: Self-pay

## 2020-07-14 NOTE — Telephone Encounter (Signed)
Called to inform pt that medicare would most likely not cover zolpidem 10 mg would he like to try the 5mg . Pt stated that the medication only cost him $5 at this time so he would like to keep it the same

## 2020-07-28 ENCOUNTER — Other Ambulatory Visit: Payer: Self-pay | Admitting: Internal Medicine

## 2020-08-07 DIAGNOSIS — H401131 Primary open-angle glaucoma, bilateral, mild stage: Secondary | ICD-10-CM | POA: Diagnosis not present

## 2020-08-11 ENCOUNTER — Other Ambulatory Visit: Payer: Self-pay | Admitting: Internal Medicine

## 2020-08-11 MED ORDER — ALBUTEROL SULFATE HFA 108 (90 BASE) MCG/ACT IN AERS: INHALATION_SPRAY | RESPIRATORY_TRACT | 5 refills | Status: DC

## 2020-08-11 MED ORDER — AZITHROMYCIN 250 MG PO TABS
ORAL_TABLET | ORAL | 0 refills | Status: AC
Start: 2020-08-11 — End: 2020-08-16

## 2020-08-12 ENCOUNTER — Other Ambulatory Visit (HOSPITAL_COMMUNITY): Payer: Self-pay | Admitting: Nurse Practitioner

## 2020-08-12 DIAGNOSIS — E6609 Other obesity due to excess calories: Secondary | ICD-10-CM

## 2020-08-12 DIAGNOSIS — Z6832 Body mass index (BMI) 32.0-32.9, adult: Secondary | ICD-10-CM

## 2020-08-12 DIAGNOSIS — E1165 Type 2 diabetes mellitus with hyperglycemia: Secondary | ICD-10-CM

## 2020-08-12 DIAGNOSIS — E66811 Other obesity due to excess calories: Secondary | ICD-10-CM

## 2020-08-12 DIAGNOSIS — I10 Essential (primary) hypertension: Secondary | ICD-10-CM

## 2020-08-12 DIAGNOSIS — U071 COVID-19: Secondary | ICD-10-CM

## 2020-08-12 NOTE — Progress Notes (Signed)
I connected by phone with Matthew Caldwell on 08/12/2020 at 6:27 PM to discuss the potential use of a new treatment for mild to moderate COVID-19 viral infection in non-hospitalized patients.  This patient is a 74 y.o. male that meets the FDA criteria for Emergency Use Authorization of COVID monoclonal antibody casirivimab/imdevimab.  Has a (+) direct SARS-CoV-2 viral test result  Has mild or moderate COVID-19   Is NOT hospitalized due to COVID-19  Is within 10 days of symptom onset  Has at least one of the high risk factor(s) for progression to severe COVID-19 and/or hospitalization as defined in EUA.  Specific high risk criteria : Older age (>/= 75 yo), BMI > 25, Diabetes and Chronic Lung Disease   I have spoken and communicated the following to the patient or parent/caregiver regarding COVID monoclonal antibody treatment:  1. FDA has authorized the emergency use for the treatment of mild to moderate COVID-19 in adults and pediatric patients with positive results of direct SARS-CoV-2 viral testing who are 89 years of age and older weighing at least 40 kg, and who are at high risk for progressing to severe COVID-19 and/or hospitalization.  2. The significant known and potential risks and benefits of COVID monoclonal antibody, and the extent to which such potential risks and benefits are unknown.  3. Information on available alternative treatments and the risks and benefits of those alternatives, including clinical trials.  4. Patients treated with COVID monoclonal antibody should continue to self-isolate and use infection control measures (e.g., wear mask, isolate, social distance, avoid sharing personal items, clean and disinfect "high touch" surfaces, and frequent handwashing) according to CDC guidelines.   5. The patient or parent/caregiver has the option to accept or refuse COVID monoclonal antibody treatment.  After reviewing this information with the patient, The patient agreed to  proceed with receiving casirivimab\imdevimab infusion and will be provided a copy of the Fact sheet prior to receiving the infusion. [place] 08/12/2020 6:27 PM

## 2020-08-13 ENCOUNTER — Ambulatory Visit (HOSPITAL_COMMUNITY)
Admission: RE | Admit: 2020-08-13 | Discharge: 2020-08-13 | Disposition: A | Discharge status: Home / Self Care | Payer: Medicare Other | Source: Ambulatory Visit | Attending: Pulmonary Disease | Admitting: Pulmonary Disease

## 2020-08-13 DIAGNOSIS — Z6832 Body mass index (BMI) 32.0-32.9, adult: Secondary | ICD-10-CM | POA: Insufficient documentation

## 2020-08-13 DIAGNOSIS — E6609 Other obesity due to excess calories: Secondary | ICD-10-CM

## 2020-08-13 DIAGNOSIS — Z23 Encounter for immunization: Secondary | ICD-10-CM | POA: Diagnosis not present

## 2020-08-13 DIAGNOSIS — E1165 Type 2 diabetes mellitus with hyperglycemia: Secondary | ICD-10-CM | POA: Diagnosis present

## 2020-08-13 DIAGNOSIS — I10 Essential (primary) hypertension: Secondary | ICD-10-CM | POA: Diagnosis present

## 2020-08-13 DIAGNOSIS — U071 COVID-19: Secondary | ICD-10-CM | POA: Diagnosis present

## 2020-08-13 MED ORDER — DIPHENHYDRAMINE HCL 50 MG/ML IJ SOLN: 50.0000 mg | Freq: Once | INTRAMUSCULAR | Status: DC | PRN

## 2020-08-13 MED ORDER — ALBUTEROL SULFATE HFA 108 (90 BASE) MCG/ACT IN AERS: 2.0000 | INHALATION_SPRAY | Freq: Once | RESPIRATORY_TRACT | Status: DC | PRN

## 2020-08-13 MED ORDER — EPINEPHRINE 0.3 MG/0.3ML IJ SOAJ: 0.3000 mg | Freq: Once | INTRAMUSCULAR | Status: DC | PRN

## 2020-08-13 MED ORDER — FAMOTIDINE IN NACL 20-0.9 MG/50ML-% IV SOLN: 20.0000 mg | Freq: Once | INTRAVENOUS | Status: DC | PRN

## 2020-08-13 MED ORDER — SODIUM CHLORIDE 0.9 % IV SOLN: INTRAVENOUS | Status: DC | PRN

## 2020-08-13 MED ORDER — SODIUM CHLORIDE 0.9 % IV SOLN
1200.0000 mg | Freq: Once | INTRAVENOUS | Status: AC
  Administered 2020-08-13: 1200 mg via INTRAVENOUS

## 2020-08-13 MED ORDER — METHYLPREDNISOLONE SODIUM SUCC 125 MG IJ SOLR: 125.0000 mg | Freq: Once | INTRAMUSCULAR | Status: DC | PRN

## 2020-08-13 NOTE — Progress Notes (Signed)
  Diagnosis: COVID-19  Physician: Dr. Joya Gaskins   Procedure: [facility] Med: casirivimab\imdevimab infusion - Provided patient with casirivimab\imdevimab fact sheet for patients, parents and caregivers prior to infusion.  Complications: No immediate complications noted.  Discharge: Discharged home   [name] 08/13/2020

## 2020-08-13 NOTE — Discharge Instructions (Signed)

## 2020-08-17 ENCOUNTER — Ambulatory Visit: Payer: Medicare Other | Admitting: Internal Medicine

## 2020-08-22 ENCOUNTER — Ambulatory Visit: Payer: Medicare Other | Admitting: Internal Medicine

## 2020-08-23 ENCOUNTER — Other Ambulatory Visit: Payer: Self-pay

## 2020-08-23 ENCOUNTER — Ambulatory Visit (INDEPENDENT_AMBULATORY_CARE_PROVIDER_SITE_OTHER): Payer: Medicare HMO | Admitting: Internal Medicine

## 2020-08-23 ENCOUNTER — Encounter: Payer: Self-pay | Admitting: Internal Medicine

## 2020-08-23 VITALS — BP 130/70 | HR 85 | Temp 98.0°F | Ht 65.0 in | Wt 185.0 lb

## 2020-08-23 DIAGNOSIS — U071 COVID-19: Secondary | ICD-10-CM

## 2020-08-23 DIAGNOSIS — R69 Illness, unspecified: Secondary | ICD-10-CM | POA: Diagnosis not present

## 2020-08-23 DIAGNOSIS — E7800 Pure hypercholesterolemia, unspecified: Secondary | ICD-10-CM | POA: Diagnosis not present

## 2020-08-23 DIAGNOSIS — F5101 Primary insomnia: Secondary | ICD-10-CM

## 2020-08-23 DIAGNOSIS — Z6830 Body mass index (BMI) 30.0-30.9, adult: Secondary | ICD-10-CM | POA: Diagnosis not present

## 2020-08-23 DIAGNOSIS — E6609 Other obesity due to excess calories: Secondary | ICD-10-CM

## 2020-08-23 NOTE — Progress Notes (Signed)
I,Tianna Badgett,acting as a Education administrator for Maximino Greenland, MD.,have documented all relevant documentation on the behalf of Maximino Greenland, MD,as directed by  Maximino Greenland, MD while in the presence of Maximino Greenland, MD.  This visit occurred during the SARS-CoV-2 public health emergency.  Safety protocols were in place, including screening questions prior to the visit, additional usage of staff PPE, and extensive cleaning of exam room while observing appropriate contact time as indicated for disinfecting solutions.  Subjective:     Patient ID: Matthew Caldwell , male    DOB: [birth year] , 74 y.o.   MRN: [id number]   Chief Complaint  Patient presents with  . Hyperlipidemia    HPI  Patient is here for cholesterol check. He was started on atorvastatin after last appointment. He states that he is compliant with medication. He also would like to be tested for COVID to confirm he is now negative. He was recently diagnosed with COVID, he did receive antibody treatment. He is still fatigued.     Past Medical History:  Diagnosis Date  . Asthma   . Hypertension   . Prostate cancer [facility])      Family History  Problem Relation Age of Onset  . Diabetes Mother   . Asthma Father   . Cancer Neg Hx      Current Outpatient Medications:  .  albuterol (VENTOLIN HFA) 108 (90 Base) MCG/ACT inhaler, TAKE 2 PUFFS BY MOUTH EVERY 6 HOURS AS NEEDED FOR WHEEZE OR SHORTNESS OF BREATH, Disp: 18 g, Rfl: 5 .  ALPRAZolam (XANAX) 0.25 MG tablet, TAKE 1 TABLET (0.25 MG TOTAL) BY MOUTH DAILY AS NEEDED., Disp: 15 tablet, Rfl: 0 .  atorvastatin (LIPITOR) 10 MG tablet, TAKE 1 TABLET BY MOUTH EVERY DAY, Disp: 90 tablet, Rfl: 1 .  ciclopirox (PENLAC) 8 % solution, Apply topically at bedtime. Apply over nail and surrounding skin. Apply daily over previous coat. After seven (7) days, may remove with alcohol and continue cycle., Disp: 6.6 mL, Rfl: 2 .  guaiFENesin (MUCINEX) 600 MG 12 hr tablet, Take 1 tablet (600 mg total)  by mouth 2 (two) times daily., Disp: 15 tablet, Rfl: 0 .  ipratropium (ATROVENT) 0.03 % nasal spray, PLACE 2 SPRAYS INTO BOTH NOSTRILS EVERY 12 (TWELVE) HOURS., Disp: 30 mL, Rfl: 2 .  lisinopril-hydrochlorothiazide (ZESTORETIC) 20-12.5 MG tablet, Take 1 tablet by mouth daily., Disp: 90 tablet, Rfl: 2 .  Multiple Vitamin (MULTIVITAMIN) tablet, Take 1 tablet by mouth daily., Disp: , Rfl:  .  NON FORMULARY, [facility]  Antifungal -- Terbinafine 3%; Fluconazole 2%; Tea Tree Oil 5%; Urea 10%; Ibuprofen 2% in DMSO Suspension #36mL  Apply to affected nail(s) once (at bedtime) or twice daily  Can have up to 5 refills  Faxed to [facility] on 01/05/20, Disp: , Rfl:  .  tadalafil (CIALIS) 20 MG tablet, Take 20 mg by mouth daily as needed., Disp: , Rfl:  .  zolpidem (AMBIEN) 10 MG tablet, TAKE 1/2 TO 1 TABLET BY MOUTH NIGHTLY AS NEEDED, Disp: 30 tablet, Rfl: 1   Allergies  Allergen Reactions  . Penicillins Rash     Review of Systems  Constitutional: Positive for fatigue.  Respiratory: Negative.   Cardiovascular: Negative.   Gastrointestinal: Negative.   Neurological: Negative.   Psychiatric/Behavioral: Positive for sleep disturbance.     Today's Vitals   08/23/20 1458  BP: 130/70  Pulse: 85  Temp: 98 F (36.7 C)  TempSrc: Oral  Weight: 185 lb (83.9 kg)  Height: 5\' 5"  (1.651 m)   Body mass index is 30.79 kg/m.   Objective:  Physical Exam Vitals and nursing note reviewed.  Constitutional:      Appearance: Normal appearance.  Cardiovascular:     Rate and Rhythm: Normal rate and regular rhythm.     Heart sounds: Normal heart sounds.  Pulmonary:     Effort: Pulmonary effort is normal.     Breath sounds: Normal breath sounds.  Skin:    General: Skin is warm.  Neurological:     General: No focal deficit present.     Mental Status: He is alert.  Psychiatric:        Mood and Affect: Mood normal.         Assessment And Plan:     1. Pure  hypercholesterolemia Comments: I will check fasting lipid panel and ALT. I will make further recommendations once his labs are available for review.  - Lipid panel - ALT  2. Primary insomnia Comments: Chronic, he was given samples of Dayvigo, 5mg  to use nightly as needed. He will let me know if this is effective for him.   3. COVID-19 Comments: Received antibody treatment on September 19th. He is still feeling fatigued. Would like to have repeat testing. Pt advised that this is not needed, but will  - Novel Coronavirus, NAA ([facility])  4. Class 1 obesity due to excess calories with serious comorbidity and body mass index (BMI) of 30.0 to 30.9 in adult Comments: His BMI is acceptable for his demographic. Encouraged to slowly increase his daily activity as tolerated.   Wt Readings from Last 3 Encounters:  08/23/20 185 lb (83.9 kg)  06/26/20 193 lb 9.6 oz (87.8 kg)  02/22/20 201 lb 6.4 oz (91.4 kg)      Patient was given opportunity to ask questions. Patient verbalized understanding of the plan and was able to repeat key elements of the plan. All questions were answered to their satisfaction.  Maximino Greenland, MD   I, Maximino Greenland, MD, have reviewed all documentation for this visit. The documentation on 08/27/20 for the exam, diagnosis, procedures, and orders are all accurate and complete.  THE PATIENT IS ENCOURAGED TO PRACTICE SOCIAL DISTANCING DUE TO THE COVID-19 PANDEMIC.

## 2020-08-23 NOTE — Patient Instructions (Signed)

## 2020-08-24 LAB — LIPID PANEL
Chol/HDL Ratio: 2.8 ratio (ref 0.0–5.0)
Cholesterol, Total: 101 mg/dL (ref 100–199)
HDL: 36 mg/dL — ABNORMAL LOW (ref >39)
LDL Chol Calc (NIH): 49 mg/dL (ref 0–99)
Triglycerides: 78 mg/dL (ref 0–149)
VLDL Cholesterol Cal: 16 mg/dL (ref 5–40)

## 2020-08-24 LAB — NOVEL CORONAVIRUS, NAA: SARS-CoV-2, NAA: NOT DETECTED

## 2020-08-24 LAB — ALT: ALT: 40 IU/L (ref 0–44)

## 2020-08-24 LAB — SARS-COV-2, NAA 2 DAY TAT

## 2020-08-31 DIAGNOSIS — C61 Malignant neoplasm of prostate: Secondary | ICD-10-CM | POA: Diagnosis not present

## 2020-09-03 ENCOUNTER — Other Ambulatory Visit: Payer: Self-pay | Admitting: Internal Medicine

## 2020-09-07 DIAGNOSIS — N400 Enlarged prostate without lower urinary tract symptoms: Secondary | ICD-10-CM | POA: Diagnosis not present

## 2020-09-07 DIAGNOSIS — Z8546 Personal history of malignant neoplasm of prostate: Secondary | ICD-10-CM | POA: Diagnosis not present

## 2020-09-20 ENCOUNTER — Other Ambulatory Visit: Payer: Self-pay | Admitting: Internal Medicine

## 2020-09-21 NOTE — Telephone Encounter (Signed)
Zolpidem refill

## 2020-09-22 ENCOUNTER — Other Ambulatory Visit: Payer: Self-pay

## 2020-09-22 ENCOUNTER — Ambulatory Visit (INDEPENDENT_AMBULATORY_CARE_PROVIDER_SITE_OTHER): Payer: Medicare HMO

## 2020-09-22 VITALS — BP 140/85 | HR 94 | Temp 98.1°F | Ht 65.0 in

## 2020-09-22 DIAGNOSIS — Z23 Encounter for immunization: Secondary | ICD-10-CM | POA: Diagnosis not present

## 2020-10-02 DIAGNOSIS — H401121 Primary open-angle glaucoma, left eye, mild stage: Secondary | ICD-10-CM | POA: Diagnosis not present

## 2020-10-21 DIAGNOSIS — Z20822 Contact with and (suspected) exposure to covid-19: Secondary | ICD-10-CM | POA: Diagnosis not present

## 2020-10-23 ENCOUNTER — Other Ambulatory Visit: Payer: Self-pay

## 2020-10-23 MED ORDER — TADALAFIL 20 MG PO TABS
20.0000 mg | ORAL_TABLET | Freq: Every day | ORAL | 2 refills | Status: DC | PRN
Start: 2020-10-23 — End: 2021-05-23

## 2020-10-24 ENCOUNTER — Other Ambulatory Visit: Payer: Self-pay | Admitting: Internal Medicine

## 2020-10-24 MED ORDER — ZOLPIDEM TARTRATE 5 MG PO TABS: 5.0000 mg | ORAL_TABLET | Freq: Every evening | ORAL | 0 refills | Status: DC | PRN

## 2020-10-25 ENCOUNTER — Ambulatory Visit (INDEPENDENT_AMBULATORY_CARE_PROVIDER_SITE_OTHER): Payer: Medicare HMO | Admitting: Internal Medicine

## 2020-10-25 ENCOUNTER — Encounter: Payer: Self-pay | Admitting: Internal Medicine

## 2020-10-25 ENCOUNTER — Other Ambulatory Visit: Payer: Self-pay

## 2020-10-25 VITALS — BP 136/68 | HR 62 | Temp 97.5°F | Ht 65.8 in | Wt 198.0 lb

## 2020-10-25 DIAGNOSIS — I10 Essential (primary) hypertension: Secondary | ICD-10-CM | POA: Diagnosis not present

## 2020-10-25 DIAGNOSIS — E6609 Other obesity due to excess calories: Secondary | ICD-10-CM

## 2020-10-25 DIAGNOSIS — F419 Anxiety disorder, unspecified: Secondary | ICD-10-CM

## 2020-10-25 DIAGNOSIS — Z6832 Body mass index (BMI) 32.0-32.9, adult: Secondary | ICD-10-CM | POA: Diagnosis not present

## 2020-10-25 DIAGNOSIS — Z0000 Encounter for general adult medical examination without abnormal findings: Secondary | ICD-10-CM | POA: Diagnosis not present

## 2020-10-25 DIAGNOSIS — E1165 Type 2 diabetes mellitus with hyperglycemia: Secondary | ICD-10-CM

## 2020-10-25 DIAGNOSIS — E7800 Pure hypercholesterolemia, unspecified: Secondary | ICD-10-CM

## 2020-10-25 DIAGNOSIS — R69 Illness, unspecified: Secondary | ICD-10-CM | POA: Diagnosis not present

## 2020-10-25 LAB — POCT URINALYSIS DIPSTICK
Bilirubin, UA: NEGATIVE
Glucose, UA: NEGATIVE
Ketones, UA: NEGATIVE
Leukocytes, UA: NEGATIVE
Nitrite, UA: NEGATIVE
Protein, UA: NEGATIVE
Spec Grav, UA: 1.025 (ref 1.010–1.025)
Urobilinogen, UA: 0.2 E.U./dL
pH, UA: 5.5 (ref 5.0–8.0)

## 2020-10-25 LAB — POCT UA - MICROALBUMIN
Albumin/Creatinine Ratio, Urine, POC: 30
Creatinine, POC: 200 mg/dL
Microalbumin Ur, POC: 10 mg/L

## 2020-10-25 MED ORDER — ALPRAZOLAM 0.25 MG PO TABS
0.2500 mg | ORAL_TABLET | Freq: Every day | ORAL | 0 refills | Status: DC | PRN
Start: 2020-10-25 — End: 2021-01-16

## 2020-10-25 NOTE — Progress Notes (Signed)
I,Katawbba Wiggins,acting as a Education administrator for Maximino Greenland, MD.,have documented all relevant documentation on the behalf of Maximino Greenland, MD,as directed by  Maximino Greenland, MD while in the presence of Maximino Greenland, MD.   Subjective:   Matthew Caldwell is a 74 y.o. male who presents for a Welcome to Medicare exam. He has no specific concerns or complaints at this time. He is moving to [place], [place] due to a job change.   Diabetes He presents for his follow-up diabetic visit. He has type 2 diabetes mellitus. Hypoglycemia symptoms include nervousness/anxiousness. Pertinent negatives for hypoglycemia include no headaches. There are no diabetic associated symptoms. Pertinent negatives for diabetes include no blurred vision and no chest pain. There are no hypoglycemic complications. Risk factors for coronary artery disease include diabetes mellitus. Current diabetic treatment includes diet. He is compliant with treatment most of the time. He is following a diabetic diet. He participates in exercise daily. An ACE inhibitor/angiotensin II receptor blocker is being taken. Eye exam is current.  Hypertension This is a chronic problem. The current episode started more than 1 year ago. The problem has been gradually improving since onset. The problem is controlled. Pertinent negatives include no blurred vision, chest pain, headaches or shortness of breath. Risk factors for coronary artery disease include diabetes mellitus, dyslipidemia, obesity, sedentary lifestyle and male gender. The current treatment provides moderate improvement. Compliance problems include exercise.     Review of Systems: Review of Systems  Constitutional: Negative.   HENT: Negative.   Eyes: Negative.  Negative for blurred vision.  Respiratory: Negative.  Negative for shortness of breath.   Cardiovascular: Negative.  Negative for chest pain.  Gastrointestinal: Negative.   Genitourinary: Negative.   Musculoskeletal:  Negative.   Skin: Negative.   Neurological: Negative.  Negative for headaches.  Endo/Heme/Allergies: Negative.   Psychiatric/Behavioral: The patient is nervous/anxious.        He admits that he has felt more anxious, especially since having COVID. Denies having panic attacks. Denies chest pain and shortness of breath.     Cardiac Risk Factors include: advanced age (>71mn, >>53women);diabetes mellitus;obesity (BMI >30kg/m2);male gender;hypertension;dyslipidemia     Objective:    Today's Vitals   10/25/20 1442  BP: 136/68  Pulse: 62  Temp: (!) 97.5 F (36.4 C)  TempSrc: Oral  Weight: 198 lb (89.8 kg)  Height: 5' 5.8" (1.671 m)   Body mass index is 32.15 kg/m. Wt Readings from Last 3 Encounters:  10/25/20 198 lb (89.8 kg)  08/23/20 185 lb (83.9 kg)  06/26/20 193 lb 9.6 oz (87.8 kg)   Medications Outpatient Encounter Medications as of 10/25/2020  Medication Sig  . albuterol (VENTOLIN HFA) 108 (90 Base) MCG/ACT inhaler TAKE 2 PUFFS BY MOUTH EVERY 6 HOURS AS NEEDED FOR WHEEZE OR SHORTNESS OF BREATH  . atorvastatin (LIPITOR) 10 MG tablet TAKE 1 TABLET BY MOUTH EVERY DAY  . ipratropium (ATROVENT) 0.03 % nasal spray PLACE 2 SPRAYS INTO BOTH NOSTRILS EVERY 12 (TWELVE) HOURS.  .Marland Kitchenlisinopril-hydrochlorothiazide (ZESTORETIC) 20-12.5 MG tablet TAKE 1 TABLET BY MOUTH EVERY DAY  . Multiple Vitamin (MULTIVITAMIN) tablet Take 1 tablet by mouth daily.  . NON FORMULARY [facility]  Antifungal -- Terbinafine 3%; Fluconazole 2%; Tea Tree Oil 5%; Urea 10%; Ibuprofen 2% in DMSO Suspension #354m Apply to affected nail(s) once (at bedtime) or twice daily  Can have up to 5 refills  Faxed to [facility] 01/05/20  . tadalafil (CIALIS) 20 MG tablet Take 1 tablet (  20 mg total) by mouth daily as needed.  . zolpidem (AMBIEN) 5 MG tablet Take 1 tablet (5 mg total) by mouth at bedtime as needed for sleep.  Marland Kitchen ALPRAZolam (XANAX) 0.25 MG tablet Take 1 tablet (0.25 mg total) by mouth  daily as needed.  . [DISCONTINUED] ALPRAZolam (XANAX) 0.25 MG tablet TAKE 1 TABLET (0.25 MG TOTAL) BY MOUTH DAILY AS NEEDED. (Patient not taking: Reported on 10/25/2020)  . [DISCONTINUED] ciclopirox (PENLAC) 8 % solution Apply topically at bedtime. Apply over nail and surrounding skin. Apply daily over previous coat. After seven (7) days, may remove with alcohol and continue cycle. (Patient not taking: Reported on 10/25/2020)  . [DISCONTINUED] guaiFENesin (MUCINEX) 600 MG 12 hr tablet Take 1 tablet (600 mg total) by mouth 2 (two) times daily. (Patient not taking: Reported on 10/25/2020)   No facility-administered encounter medications on file as of 10/25/2020.     History: Past Medical History:  Diagnosis Date  . Allergy   . Asthma   . Hypertension   . Prostate cancer [facility]    Past Surgical History:  Procedure Laterality Date  . MOUTH SURGERY    . PROSTATE BIOPSY      Family History  Problem Relation Age of Onset  . Diabetes Mother   . Asthma Father   . Cancer Neg Hx    Social History   Occupational History  . Not on file  Tobacco Use  . Smoking status: Former Smoker    Packs/day: 0.25    Years: 1.00    Pack years: 0.25    Types: Cigarettes    Quit date: 11/25/1997    Years since quitting: 22.9  . Smokeless tobacco: Never Used  . Tobacco comment: He has already quit smoking  Vaping Use  . Vaping Use: Never used  Substance and Sexual Activity  . Alcohol use: Yes    Comment: 3 a week  . Drug use: No  . Sexual activity: Yes   Tobacco Counseling Counseling given: No Comment: He has already quit smoking   Immunizations and Health Maintenance Immunization History  Administered Date(s) Administered  . Fluad Quad(high Dose 65+) 09/22/2020  . Influenza, High Dose Seasonal PF 08/13/2019  . Influenza-Unspecified 06/25/2018, 08/13/2019  . PFIZER SARS-COV-2 Vaccination 12/16/2019, 01/06/2020  . Pneumococcal Polysaccharide-23 10/29/2019  . Tdap 05/15/2015   Health  Maintenance Due  Topic Date Due  . COVID-19 Vaccine (3 - Pfizer risk 4-dose series) 02/03/2020    Activities of Daily Living In your present state of health, do you have any difficulty performing the following activities: 10/25/2020 10/25/2020  Hearing? N N  Vision? N N  Difficulty concentrating or making decisions? N N  Walking or climbing stairs? N N  Dressing or bathing? N N  Doing errands, shopping? - Scientist, forensic and eating ? N N  Using the Toilet? N N  In the past six months, have you accidently leaked urine? N N  Do you have problems with loss of bowel control? N N  Managing your Medications? N N  Managing your Finances? N N  Housekeeping or managing your Housekeeping? N N  Some recent data might be hidden    Physical Exam   Physical Exam Vitals and nursing note reviewed.  Constitutional:      Appearance: Normal appearance. He is obese.  HENT:     Head: Normocephalic and atraumatic.     Right Ear: Tympanic membrane, ear canal and external ear normal. There is no impacted cerumen.  Left Ear: Tympanic membrane, ear canal and external ear normal. There is no impacted cerumen.     Nose:     Comments: Deferred, masked    Mouth/Throat:     Comments: Deferred, masked Eyes:     Extraocular Movements: Extraocular movements intact.     Conjunctiva/sclera: Conjunctivae normal.     Pupils: Pupils are equal, round, and reactive to light.  Cardiovascular:     Rate and Rhythm: Normal rate and regular rhythm.     Pulses:          Dorsalis pedis pulses are 2+ on the right side and 2+ on the left side.  Pulmonary:     Effort: Pulmonary effort is normal.  Abdominal:     General: Bowel sounds are normal.     Palpations: Abdomen is soft.  Genitourinary:    Comments: Deferred  Musculoskeletal:        General: Normal range of motion.     Cervical back: Normal range of motion and neck supple.  Feet:     Right foot:     Protective Sensation: 5 sites tested. 5 sites sensed.      Skin integrity: Dry skin present.     Toenail Condition: Right toenails are long.     Left foot:     Protective Sensation: 5 sites tested. 5 sites sensed.     Skin integrity: Dry skin present.     Toenail Condition: Left toenails are long.  Skin:    General: Skin is warm and dry.  Neurological:     General: No focal deficit present.     Mental Status: He is alert and oriented to person, place, and time.  Psychiatric:        Mood and Affect: Mood and affect normal.        Behavior: Behavior normal.        Thought Content: Thought content normal.        Judgment: Judgment normal.    Advanced Directives: Does Patient Have a Medical Advance Directive?: Yes Type of Advance Directive: Healthcare Power of Attorney    Assessment:    This is a routine wellness  examination for this patient   Vision/Hearing screen  Hearing Screening   _0  _1  _2  _3  _4  _5  _6  _7  _8   Right ear:  _9 Left ear:  _10 Visual Acuity Screening   Right eye Left eye Both eyes  Without correction:     With correction: _11    Dietary issues and exercise activities discussed:  Current Exercise Habits: Home exercise routine, Type of exercise: Other - see comments;calisthenics (stationery bike, martial arts,), Time (Minutes): 60, Frequency (Times/Week): 7, Weekly Exercise (Minutes/Week): 420, Intensity: Intense, Exercise limited by: cardiac condition(s)  Goals    . Blood Pressure < 140/90     To achieve optimal blood pressure of <130/80.        Depression Screen PHQ 2/9 Scores 10/25/2020 06/26/2020 06/21/2019 12/16/2018  PHQ - 2 Score 0 0 0 0     Fall Risk Fall Risk  10/25/2020  Falls in the past year? 0  Number falls in past yr: -  Injury with Fall? -    Cognitive Function     6CIT Screen 10/25/2020  What Year? 0 points  What month? 0 points  What time? 0 points  Count back from 20 0 points  Months in  reverse 0 points   Repeat phrase 0 points  Total Score 0    Patient Care Team: Glendale Chard, MD as PCP - General (Internal Medicine)     Plan:    1. Medicare annual wellness visit, initial Comments: A full exam was performed. DRE deferred, as per patient request. The annual wellness visit was performed including discussion of advanced directives, assessment of functional status and cognitive function. EKG performed, NSR w/o acute changes. PATIENT IS ADVISED TO GET 30-45 MINUTES REGULAR EXERCISE NO LESS THAN FOUR TO FIVE DAYS PER WEEK - BOTH WEIGHTBEARING EXERCISES AND AEROBIC ARE RECOMMENDED.  PATIENT IS ADVISED TO FOLLOW A HEALTHY DIET WITH AT LEAST SIX FRUITS/VEGGIES PER DAY, DECREASE INTAKE OF RED MEAT, AND TO INCREASE FISH INTAKE TO TWO DAYS PER WEEK.  MEATS/FISH SHOULD NOT BE FRIED, BAKED OR BROILED IS PREFERABLE.  I SUGGEST WEARING SPF 50 SUNSCREEN ON EXPOSED PARTS AND ESPECIALLY WHEN IN THE DIRECT SUNLIGHT FOR AN EXTENDED PERIOD OF TIME.  PLEASE AVOID FAST FOOD RESTAURANTS AND INCREASE YOUR WATER INTAKE.'  2. Type 2 diabetes mellitus with hyperglycemia, without long-term current use of insulin (HCC) Comments: Diabetic foot exam was performed. I DISCUSSED WITH THE PATIENT AT LENGTH REGARDING THE GOALS OF GLYCEMIC CONTROL AND POSSIBLE LONG-TERM COMPLICATIONS.  I  ALSO STRESSED THE IMPORTANCE OF COMPLIANCE WITH HOME GLUCOSE MONITORING, DIETARY RESTRICTIONS INCLUDING AVOIDANCE OF SUGARY DRINKS/PROCESSED FOODS,  ALONG WITH REGULAR EXERCISE.  I  ALSO STRESSED THE IMPORTANCE OF ANNUAL EYE EXAMS, SELF FOOT CARE AND COMPLIANCE WITH OFFICE VISITS.  - CBC - Hemoglobin A1c - CMP14+EGFR - POCT Urinalysis Dipstick (81002) - POCT UA - Microalbumin  3. Essential hypertension, benign Comments: Fair control. He is aware that optimal BP is less than 130/80. He is encouraged to avoid adding salt to his foods.  EKG performed, NSR w/o acute changes.  - EKG 12-Lead  4. Pure hypercholesterolemia Comments: He is encouraged  to avoid fried foods, increase exercise and increase his fiber intake.  Importance of statin compliance was discussed with the patient.    5. Anxiety Comments: PMDP reviewed. He was given rx alprazolam to take prn. He may also benefit from magnesium supplementation, taken nightly.   6. Class 1 obesity due to excess calories with serious comorbidity and body mass index (BMI) of 32.0 to 32.9 in adult He is encouraged to strive for BMi less than 30 to decrease cardiac risk. He is encouraged to aim for at least 150 minutes of exercise per week.   I have personally reviewed and noted the following in the patient's chart:   . Medical and social history . Use of alcohol, tobacco or illicit drugs  . Current medications and supplements . Functional ability and status . Nutritional status . Physical activity . Advanced directives . List of other physicians . Hospitalizations, surgeries, and ER visits in previous 12 months . Vitals . Screenings to include cognitive, depression, and falls . Referrals and appointments  In addition, I have reviewed and discussed with patient certain preventive protocols, quality metrics, and best practice recommendations. A written personalized care plan for preventive services as well as general preventive health recommendations were provided to patient.    Maximino Greenland, MD 11/13/2020

## 2020-10-25 NOTE — Patient Instructions (Signed)
  Matthew Caldwell , Thank you for taking time to come for your Medicare Wellness Visit. I appreciate your ongoing commitment to your health goals. Please review the following plan we discussed and let me know if I can assist you in the future.   These are the goals we discussed: Goals    . Blood Pressure < 140/90     To achieve optimal blood pressure of <130/80.        This is a list of the screening recommended for you and due dates:  Health Maintenance  Topic Date Due  . Eye exam for diabetics  12/06/2020  . Hemoglobin A1C  12/27/2020  . Complete foot exam   10/25/2021  . Tetanus Vaccine  05/14/2025  . Colon Cancer Screening  06/22/2029  . Flu Shot  Completed  . COVID-19 Vaccine  Completed  .  Hepatitis C: One time screening is recommended by Center for Disease Control  (CDC) for  adults born from [birth year] through 1966.   Completed  . Pneumonia vaccines  Completed

## 2020-10-26 LAB — CMP14+EGFR
ALT: 23 IU/L (ref 0–44)
AST: 36 IU/L (ref 0–40)
Albumin/Globulin Ratio: 1.6 (ref 1.2–2.2)
Albumin: 4.7 g/dL (ref 3.7–4.7)
Alkaline Phosphatase: 40 IU/L — ABNORMAL LOW (ref 44–121)
BUN/Creatinine Ratio: 17 (ref 10–24)
BUN: 19 mg/dL (ref 8–27)
Bilirubin Total: 0.7 mg/dL (ref 0.0–1.2)
CO2: 23 mmol/L (ref 20–29)
Calcium: 9.7 mg/dL (ref 8.6–10.2)
Chloride: 102 mmol/L (ref 96–106)
Creatinine, Ser: 1.15 mg/dL (ref 0.76–1.27)
GFR calc Af Amer: 72 mL/min/{1.73_m2} (ref >59)
GFR calc non Af Amer: 62 mL/min/{1.73_m2} (ref >59)
Globulin, Total: 3 g/dL (ref 1.5–4.5)
Glucose: 116 mg/dL — ABNORMAL HIGH (ref 65–99)
Potassium: 4.5 mmol/L (ref 3.5–5.2)
Sodium: 138 mmol/L (ref 134–144)
Total Protein: 7.7 g/dL (ref 6.0–8.5)

## 2020-10-26 LAB — CBC
Hematocrit: 36.6 % — ABNORMAL LOW (ref 37.5–51.0)
Hemoglobin: 12.7 g/dL — ABNORMAL LOW (ref 13.0–17.7)
MCH: 32.8 pg (ref 26.6–33.0)
MCHC: 34.7 g/dL (ref 31.5–35.7)
MCV: 95 fL (ref 79–97)
Platelets: 228 10*3/uL (ref 150–450)
RBC: 3.87 x10E6/uL — ABNORMAL LOW (ref 4.14–5.80)
RDW: 13.8 % (ref 11.6–15.4)
WBC: 6.9 10*3/uL (ref 3.4–10.8)

## 2020-10-26 LAB — HEMOGLOBIN A1C
Est. average glucose Bld gHb Est-mCnc: 126 mg/dL
Hgb A1c MFr Bld: 6 % — ABNORMAL HIGH (ref 4.8–5.6)

## 2020-11-07 ENCOUNTER — Telehealth: Payer: Self-pay

## 2020-11-07 NOTE — Telephone Encounter (Signed)
Spoke with patient abt approval.

## 2020-11-12 ENCOUNTER — Encounter: Payer: Self-pay | Admitting: Internal Medicine

## 2020-11-16 ENCOUNTER — Telehealth: Payer: Self-pay

## 2020-11-16 NOTE — Telephone Encounter (Signed)
Spoke with patient, asking if he was taking atorvastatin for his cholesterol. Patient said he is taking as prescribed, with no complaints.

## 2020-11-27 ENCOUNTER — Other Ambulatory Visit: Payer: Self-pay | Admitting: Internal Medicine

## 2020-12-09 LAB — HEMOCCULT GUIAC POC 1CARD (OFFICE)
Card #2 Fecal Occult Blod, POC: NEGATIVE
Card #3 Fecal Occult Blood, POC: NEGATIVE
Fecal Occult Blood, POC: NEGATIVE

## 2020-12-15 ENCOUNTER — Other Ambulatory Visit: Payer: Self-pay

## 2020-12-20 ENCOUNTER — Other Ambulatory Visit (INDEPENDENT_AMBULATORY_CARE_PROVIDER_SITE_OTHER): Payer: Medicare HMO

## 2020-12-20 DIAGNOSIS — D649 Anemia, unspecified: Secondary | ICD-10-CM

## 2021-01-09 ENCOUNTER — Other Ambulatory Visit: Payer: Self-pay | Admitting: Internal Medicine

## 2021-01-09 NOTE — Telephone Encounter (Signed)
Refills

## 2021-01-16 ENCOUNTER — Other Ambulatory Visit: Payer: Self-pay | Admitting: Internal Medicine

## 2021-01-16 MED ORDER — ZOLPIDEM TARTRATE 5 MG PO TABS: 5.0000 mg | ORAL_TABLET | Freq: Every evening | ORAL | 0 refills | Status: DC | PRN

## 2021-01-16 MED ORDER — ALPRAZOLAM 0.25 MG PO TABS
0.2500 mg | ORAL_TABLET | Freq: Every day | ORAL | 0 refills | Status: DC | PRN
Start: 2021-01-16 — End: 2021-01-17

## 2021-01-18 ENCOUNTER — Telehealth: Payer: Self-pay

## 2021-01-18 NOTE — Telephone Encounter (Signed)
The [facility] and [facility] [place] [facility] and [place] [facility] [place], [place].  The pt wants to know if the prescriptions can be sent to his new pharmacy.  His pharmacy information was updated

## 2021-01-24 ENCOUNTER — Other Ambulatory Visit: Payer: Self-pay | Admitting: Internal Medicine

## 2021-01-24 MED ORDER — ZOLPIDEM TARTRATE 10 MG PO TABS
ORAL_TABLET | ORAL | 1 refills | Status: DC
Start: 2021-01-24 — End: 2021-06-06

## 2021-01-24 MED ORDER — ALPRAZOLAM 0.25 MG PO TABS: 0.2500 mg | ORAL_TABLET | Freq: Every day | ORAL | 0 refills | Status: AC | PRN

## 2021-01-30 ENCOUNTER — Encounter: Payer: Self-pay | Admitting: Internal Medicine

## 2021-01-30 ENCOUNTER — Telehealth (INDEPENDENT_AMBULATORY_CARE_PROVIDER_SITE_OTHER): Payer: Medicare Other | Admitting: Internal Medicine

## 2021-01-30 ENCOUNTER — Other Ambulatory Visit: Payer: Self-pay

## 2021-01-30 VITALS — Ht 65.8 in

## 2021-01-30 DIAGNOSIS — E7800 Pure hypercholesterolemia, unspecified: Secondary | ICD-10-CM | POA: Diagnosis not present

## 2021-01-30 DIAGNOSIS — D649 Anemia, unspecified: Secondary | ICD-10-CM

## 2021-01-30 DIAGNOSIS — E1165 Type 2 diabetes mellitus with hyperglycemia: Secondary | ICD-10-CM | POA: Diagnosis not present

## 2021-01-30 DIAGNOSIS — I10 Essential (primary) hypertension: Secondary | ICD-10-CM | POA: Diagnosis not present

## 2021-01-30 DIAGNOSIS — Z79899 Other long term (current) drug therapy: Secondary | ICD-10-CM

## 2021-01-30 NOTE — Progress Notes (Signed)
Virtual Visit via Video   This visit type was conducted due to national recommendations for restrictions regarding the COVID-19 Pandemic (e.g. social distancing) in an effort to limit this patient's exposure and mitigate transmission in our community.  Due to his co-morbid illnesses, this patient is at least at moderate risk for complications without adequate follow up.  This format is felt to be most appropriate for this patient at this time.  All issues noted in this document were discussed and addressed.  A limited physical exam was performed with this format.    This visit type was conducted due to national recommendations for restrictions regarding the COVID-19 Pandemic (e.g. social distancing) in an effort to limit this patient's exposure and mitigate transmission in our community.  Patients identity confirmed using two different identifiers.  This format is felt to be most appropriate for this patient at this time.  All issues noted in this document were discussed and addressed.  No physical exam was performed (except for noted visual exam findings with Video Visits).    Date:  01/30/2021   ID:  Matthew Caldwell, Matthew Caldwell January 06, 1946, MRN [id number]  Patient Location:  Home in [place], [place]  Provider location:   Office  Chief Complaint:  "I have diabetes check"  History of Present Illness:    Matthew Caldwell is a 75 y.o. male who presents via video conferencing for a telehealth visit today.    The patient does not have symptoms concerning for COVID-19 infection (fever, chills, cough, or new shortness of breath).   The patient is being seen virtually for his appointment due to his recent move to [facility]. He presents today for DM/HTN check. He feels well. He is getting acclimated to his new environment. He is happy with the current weather. He has no specific concerns or complaints at this time.   Diabetes He presents for his follow-up diabetic visit. He has type 2 diabetes mellitus.  Hypoglycemia symptoms include nervousness/anxiousness. Pertinent negatives for hypoglycemia include no headaches. There are no diabetic associated symptoms. Pertinent negatives for diabetes include no blurred vision and no chest pain. There are no hypoglycemic complications. Risk factors for coronary artery disease include diabetes mellitus. Current diabetic treatment includes diet. He is compliant with treatment most of the time. He is following a diabetic diet. He participates in exercise daily. An ACE inhibitor/angiotensin II receptor blocker is being taken. Eye exam is current.  Hypertension This is a chronic problem. The current episode started more than 1 year ago. The problem has been gradually improving since onset. The problem is controlled. Pertinent negatives include no blurred vision, chest pain, headaches or shortness of breath. Risk factors for coronary artery disease include diabetes mellitus, dyslipidemia, obesity, sedentary lifestyle and male gender. The current treatment provides moderate improvement. Compliance problems include exercise.      Past Medical History:  Diagnosis Date  . Allergy   . Asthma   . Hypertension   . Prostate cancer [facility]    Past Surgical History:  Procedure Laterality Date  . MOUTH SURGERY    . PROSTATE BIOPSY       No outpatient medications have been marked as taking for the 01/30/21 encounter (Video Visit) with Glendale Chard, MD.     Allergies:   Penicillins   Social History   Tobacco Use  . Smoking status: Former Smoker    Packs/day: 0.25    Years: 1.00    Pack years: 0.25    Types: Cigarettes    Quit date:  11/25/1997    Years since quitting: 23.1  . Smokeless tobacco: Never Used  . Tobacco comment: He has already quit smoking  Vaping Use  . Vaping Use: Never used  Substance Use Topics  . Alcohol use: Yes    Comment: 3 a week  . Drug use: No     Family Hx: The patient's family history includes Asthma in his father; Diabetes in his  mother. There is no history of Cancer.  ROS:   Please see the history of present illness.    Review of Systems  Constitutional: Negative.   Eyes: Negative for blurred vision.  Respiratory: Negative.  Negative for shortness of breath.   Cardiovascular: Negative.  Negative for chest pain.  Gastrointestinal: Negative.   Neurological: Negative.  Negative for headaches.  Psychiatric/Behavioral: The patient is nervous/anxious.     All other systems reviewed and are negative.   Labs/Other Tests and Data Reviewed:    Recent Labs: 10/25/2020: ALT 23; BUN 19; Creatinine, Ser 1.15; Hemoglobin 12.7; Platelets 228; Potassium 4.5; Sodium 138   Recent Lipid Panel Lab Results  Component Value Date/Time   CHOL 101 08/23/2020 04:00 PM   TRIG 78 08/23/2020 04:00 PM   HDL 36 (L) 08/23/2020 04:00 PM   CHOLHDL 2.8 08/23/2020 04:00 PM   LDLCALC 49 08/23/2020 04:00 PM    Wt Readings from Last 3 Encounters:  10/25/20 198 lb (89.8 kg)  08/23/20 185 lb (83.9 kg)  06/26/20 193 lb 9.6 oz (87.8 kg)     Exam:    Vital Signs:  Ht 5' 5.8" (1.671 m)   BMI 32.15 kg/m     Physical Exam Vitals and nursing note reviewed.  Constitutional:      Appearance: Normal appearance.  HENT:     Head: Normocephalic and atraumatic.  Pulmonary:     Effort: Pulmonary effort is normal.     Breath sounds: Normal breath sounds.  Musculoskeletal:     Cervical back: Normal range of motion.  Neurological:     Mental Status: He is alert and oriented to person, place, and time. Mental status is at baseline.  Psychiatric:        Mood and Affect: Mood and affect normal.        Behavior: Behavior normal.     ASSESSMENT & PLAN:    1. Type 2 diabetes mellitus with hyperglycemia, without long-term current use of insulin (HCC) Comments: Chronic. I will request eye exam from Dr. Katy Fitch. He agrees to find local [place] for lab draw. He is advised to establish care with new provider w/in 90 days.  - CBC with Diff;  Future - BMP8+EGFR; Future - Hemoglobin A1c; Future  2. Essential hypertension, benign Comments: Chronic, not assessed today. Encouraged to follow low sodium diet.   3. Pure hypercholesterolemia Comments: Chronic, he does not wish to take statin therapy. Advised to avoid fried foods, increase exercise and increase fiber intake.   4. Anemia, unspecified type Comments: Previous labs reviewed. I will order CBC/ Fe/TIBC today. Possibly related to renal function.  - CBC with Diff; Future - Iron and IBC ([id number]); Future  5. Drug therapy    COVID-19 Education: The signs and symptoms of COVID-19 were discussed with the patient and how to seek care for testing (follow up with PCP or arrange E-visit).  The importance of social distancing was discussed today.  Patient Risk:   After full review of this patients clinical status, I feel that they are at least moderate risk at this  time.  Time:   Today, I have spent 20 minutes/ seconds with the patient with telehealth technology discussing above diagnoses.     Medication Adjustments/Labs and Tests Ordered: Current medicines are reviewed at length with the patient today.  Concerns regarding medicines are outlined above.   Tests Ordered: Orders Placed This Encounter  Procedures  . CBC with Diff  . BMP8+EGFR  . Iron and IBC ([id number])  . Hemoglobin A1c    Medication Changes: No orders of the defined types were placed in this encounter.   Disposition:  Follow up prn  Signed, Maximino Greenland, MD

## 2021-01-30 NOTE — Patient Instructions (Signed)
Diabetes Mellitus and Foot Care Foot care is an important part of your health, especially when you have diabetes. Diabetes may cause you to have problems because of poor blood flow (circulation) to your feet and legs, which can cause your skin to:  Become thinner and drier.  Break more easily.  Heal more slowly.  Peel and crack. You may also have nerve damage (neuropathy) in your legs and feet, causing decreased feeling in them. This means that you may not notice minor injuries to your feet that could lead to more serious problems. Noticing and addressing any potential problems early is the best way to prevent future foot problems. How to care for your feet Foot hygiene  Wash your feet daily with warm water and mild soap. Do not use hot water. Then, pat your feet and the areas between your toes until they are completely dry. Do not soak your feet as this can dry your skin.  Trim your toenails straight across. Do not dig under them or around the cuticle. File the edges of your nails with an emery board or nail file.  Apply a moisturizing lotion or petroleum jelly to the skin on your feet and to dry, brittle toenails. Use lotion that does not contain alcohol and is unscented. Do not apply lotion between your toes.   Shoes and socks  Wear clean socks or stockings every day. Make sure they are not too tight. Do not wear knee-high stockings since they may decrease blood flow to your legs.  Wear shoes that fit properly and have enough cushioning. Always look in your shoes before you put them on to be sure there are no objects inside.  To break in new shoes, wear them for just a few hours a day. This prevents injuries on your feet. Wounds, scrapes, corns, and calluses  Check your feet daily for blisters, cuts, bruises, sores, and redness. If you cannot see the bottom of your feet, use a mirror or ask someone for help.  Do not cut corns or calluses or try to remove them with medicine.  If you  find a minor scrape, cut, or break in the skin on your feet, keep it and the skin around it clean and dry. You may clean these areas with mild soap and water. Do not clean the area with peroxide, alcohol, or iodine.  If you have a wound, scrape, corn, or callus on your foot, look at it several times a day to make sure it is healing and not infected. Check for: ? Redness, swelling, or pain. ? Fluid or blood. ? Warmth. ? Pus or a bad smell.   General tips  Do not cross your legs. This may decrease blood flow to your feet.  Do not use heating pads or hot water bottles on your feet. They may burn your skin. If you have lost feeling in your feet or legs, you may not know this is happening until it is too late.  Protect your feet from hot and cold by wearing shoes, such as at the beach or on hot pavement.  Schedule a complete foot exam at least once a year (annually) or more often if you have foot problems. Report any cuts, sores, or bruises to your health care provider immediately. Where to find more information  American Diabetes Association: www.diabetes.org  Association of Diabetes [employer] & Education Specialists: www.diabeteseducator.org Contact a health care provider if:  You have a medical condition that increases your risk of infection and   you have any cuts, sores, or bruises on your feet.  You have an injury that is not healing.  You have redness on your legs or feet.  You feel burning or tingling in your legs or feet.  You have pain or cramps in your legs and feet.  Your legs or feet are numb.  Your feet always feel cold.  You have pain around any toenails. Get help right away if:  You have a wound, scrape, corn, or callus on your foot and: ? You have pain, swelling, or redness that gets worse. ? You have fluid or blood coming from the wound, scrape, corn, or callus. ? Your wound, scrape, corn, or callus feels warm to the touch. ? You have pus or a bad smell coming from  the wound, scrape, corn, or callus. ? You have a fever. ? You have a red line going up your leg. Summary  Check your feet every day for blisters, cuts, bruises, sores, and redness.  Apply a moisturizing lotion or petroleum jelly to the skin on your feet and to dry, brittle toenails.  Wear shoes that fit properly and have enough cushioning.  If you have foot problems, report any cuts, sores, or bruises to your health care provider immediately.  Schedule a complete foot exam at least once a year (annually) or more often if you have foot problems. This information is not intended to replace advice given to you by your health care provider. Make sure you discuss any questions you have with your health care provider. Document Revised: 06/01/2020 Document Reviewed: 06/01/2020 [employer] Patient Education  2021 [employer]  

## 2021-02-21 ENCOUNTER — Other Ambulatory Visit: Payer: Self-pay | Admitting: Internal Medicine

## 2021-02-22 LAB — BASIC METABOLIC PANEL
BUN/Creatinine Ratio: 14 (ref 10–24)
BUN: 14 mg/dL (ref 8–27)
CO2: 25 mmol/L (ref 20–29)
Calcium: 9.5 mg/dL (ref 8.6–10.2)
Chloride: 100 mmol/L (ref 96–106)
Creatinine, Ser: 0.98 mg/dL (ref 0.76–1.27)
Glucose: 111 mg/dL — ABNORMAL HIGH (ref 65–99)
Potassium: 4.3 mmol/L (ref 3.5–5.2)
Sodium: 141 mmol/L (ref 134–144)
eGFR: 80 mL/min/{1.73_m2} (ref >59)

## 2021-02-22 LAB — CBC
Hematocrit: 36.3 % — ABNORMAL LOW (ref 37.5–51.0)
Hemoglobin: 13 g/dL (ref 13.0–17.7)
MCH: 32.2 pg (ref 26.6–33.0)
MCHC: 35.8 g/dL — ABNORMAL HIGH (ref 31.5–35.7)
MCV: 90 fL (ref 79–97)
Platelets: 239 10*3/uL (ref 150–450)
RBC: 4.04 x10E6/uL — ABNORMAL LOW (ref 4.14–5.80)
RDW: 10.8 % — ABNORMAL LOW (ref 11.6–15.4)
WBC: 7.5 10*3/uL (ref 3.4–10.8)

## 2021-02-22 LAB — IRON AND TIBC
Iron Saturation: 50 % (ref 15–55)
Iron: 151 ug/dL (ref 38–169)
Total Iron Binding Capacity: 304 ug/dL (ref 250–450)
UIBC: 153 ug/dL (ref 111–343)

## 2021-02-22 LAB — HGB A1C W/O EAG: Hgb A1c MFr Bld: 6.4 % — ABNORMAL HIGH (ref 4.8–5.6)

## 2021-02-27 ENCOUNTER — Other Ambulatory Visit: Payer: Self-pay | Admitting: Internal Medicine

## 2021-04-30 ENCOUNTER — Ambulatory Visit: Payer: Medicare Other | Admitting: Internal Medicine

## 2021-05-16 ENCOUNTER — Other Ambulatory Visit: Payer: Self-pay | Admitting: Internal Medicine

## 2021-05-23 ENCOUNTER — Telehealth: Payer: Self-pay

## 2021-05-23 ENCOUNTER — Other Ambulatory Visit: Payer: Self-pay | Admitting: Internal Medicine

## 2021-05-23 ENCOUNTER — Other Ambulatory Visit: Payer: Self-pay

## 2021-05-23 MED ORDER — LISINOPRIL-HYDROCHLOROTHIAZIDE 20-12.5 MG PO TABS: 1.0000 | ORAL_TABLET | Freq: Every day | ORAL | 0 refills | Status: AC

## 2021-05-23 MED ORDER — TADALAFIL 20 MG PO TABS: 20.0000 mg | ORAL_TABLET | Freq: Every day | ORAL | 0 refills | Status: AC | PRN

## 2021-05-23 MED ORDER — ALBUTEROL SULFATE HFA 108 (90 BASE) MCG/ACT IN AERS: INHALATION_SPRAY | RESPIRATORY_TRACT | 0 refills | Status: AC

## 2021-05-23 MED ORDER — IPRATROPIUM BROMIDE 0.03 % NA SOLN: 2.0000 | Freq: Two times a day (BID) | NASAL | 0 refills | Status: AC

## 2021-05-23 MED ORDER — ATORVASTATIN CALCIUM 10 MG PO TABS
10.0000 mg | ORAL_TABLET | Freq: Every day | ORAL | 0 refills | Status: DC
Start: 2021-05-23 — End: 2021-07-06

## 2021-05-23 NOTE — Telephone Encounter (Signed)
The pt was notified that DR [facility]aird [facility] will refill his medications for 30 days and that he would need to get future refills from his new provider.

## 2021-06-01 ENCOUNTER — Telehealth: Payer: Self-pay

## 2021-06-01 NOTE — Telephone Encounter (Signed)
-----   Message from Glendale Chard, MD sent at 06/01/2021  9:29 AM EDT ----- What day is his appt again?  ----- Message ----- From: Michelle Nasuti, [place]: 05/31/2021   4:54 PM EDT To: Glendale Chard, MD  The patients last fill for alprazolam and ambien isn't at the pharmacy.  I checked.

## 2021-06-01 NOTE — Telephone Encounter (Signed)
I left the pt a message to call the office back. 

## 2021-06-06 ENCOUNTER — Other Ambulatory Visit: Payer: Self-pay | Admitting: Internal Medicine

## 2021-06-06 MED ORDER — ZOLPIDEM TARTRATE 10 MG PO TABS: ORAL_TABLET | ORAL | 0 refills | Status: AC

## 2021-06-28 ENCOUNTER — Other Ambulatory Visit: Payer: Self-pay | Admitting: Internal Medicine

## 2021-07-06 ENCOUNTER — Other Ambulatory Visit: Payer: Self-pay | Admitting: Internal Medicine

## 2021-07-11 ENCOUNTER — Other Ambulatory Visit: Payer: Self-pay | Admitting: Internal Medicine

## 2021-08-03 ENCOUNTER — Other Ambulatory Visit: Payer: Self-pay | Admitting: Internal Medicine

## 2021-08-19 ENCOUNTER — Other Ambulatory Visit: Payer: Self-pay | Admitting: Internal Medicine

## 2021-10-22 ENCOUNTER — Telehealth: Payer: Self-pay | Admitting: Internal Medicine

## 2021-10-22 NOTE — Telephone Encounter (Signed)
Spoke to patient to schedule AWV with [facility], Patient stated he moved to [facility].  He is looking for new PCP

## 2022-01-27 ENCOUNTER — Other Ambulatory Visit: Payer: Self-pay | Admitting: Internal Medicine

## 2022-04-30 ENCOUNTER — Other Ambulatory Visit: Payer: Self-pay | Admitting: Internal Medicine
